# Patient Record
Sex: Male | Born: 1940
Health system: Southern US, Community
[De-identification: ages and names within clinical notes are randomized; demographics above are authoritative.]

## PROBLEM LIST (undated history)

## (undated) DIAGNOSIS — I10 Essential (primary) hypertension: Secondary | ICD-10-CM

## (undated) DIAGNOSIS — H409 Unspecified glaucoma: Secondary | ICD-10-CM

## (undated) DIAGNOSIS — F0280 Dementia in other diseases classified elsewhere without behavioral disturbance: Secondary | ICD-10-CM

## (undated) DIAGNOSIS — G309 Alzheimer's disease, unspecified: Secondary | ICD-10-CM

## (undated) DIAGNOSIS — K219 Gastro-esophageal reflux disease without esophagitis: Secondary | ICD-10-CM

## (undated) HISTORY — DX: Dementia in other diseases classified elsewhere, unspecified severity, without behavioral disturbance, psychotic disturbance, mood disturbance, and anxiety: F02.80

## (undated) HISTORY — DX: Unspecified glaucoma: H40.9

## (undated) HISTORY — DX: Essential (primary) hypertension: I10

## (undated) HISTORY — DX: Alzheimer's disease, unspecified: G30.9

## (undated) HISTORY — DX: Gastro-esophageal reflux disease without esophagitis: K21.9

---

## 2008-08-22 ENCOUNTER — Encounter: Admission: RE | Admit: 2008-08-22 | Discharge: 2008-08-22 | Payer: Self-pay | Admitting: Internal Medicine

## 2009-08-19 ENCOUNTER — Encounter: Admission: RE | Admit: 2009-08-19 | Discharge: 2009-08-19 | Payer: Self-pay | Admitting: Gastroenterology

## 2010-01-26 ENCOUNTER — Encounter: Payer: Self-pay | Admitting: Internal Medicine

## 2011-05-15 IMAGING — CT CT ABDOMEN W/ CM
3 of 8 series · 14 of 36 positions shown, 19 images · IV contrast (READICAT/WATER & [ID] OMNI 300)
Comparison: None

CT CHEST

CLINICAL DATA: 20 pounds weight loss with decreased appetite.  Ex-
smoker.

CT CHEST, ABDOMEN AND PELVIS WITH CONTRAST
TECHNIQUE: Contiguous axial images of the chest abdomen and pelvis
were obtained after IV contrast administration.
Contrast: 100  ml [id number]

[Series 3: chest/abd/pelvis · axial · 0.62mm/px · z∈[-500,-374]mm · 2 of 124 slices shown]
[im 25/124  soft-tissue]
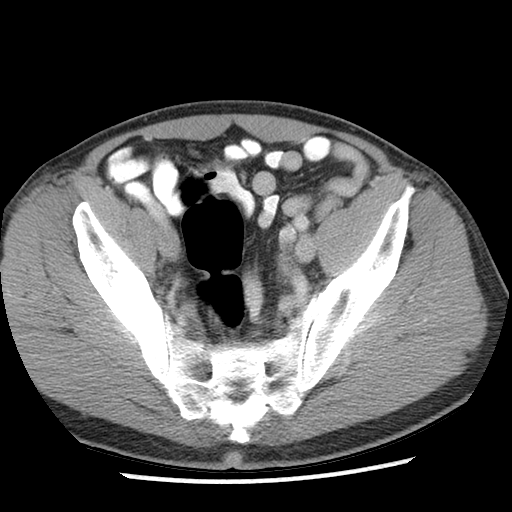
[im 50/124  soft-tissue]
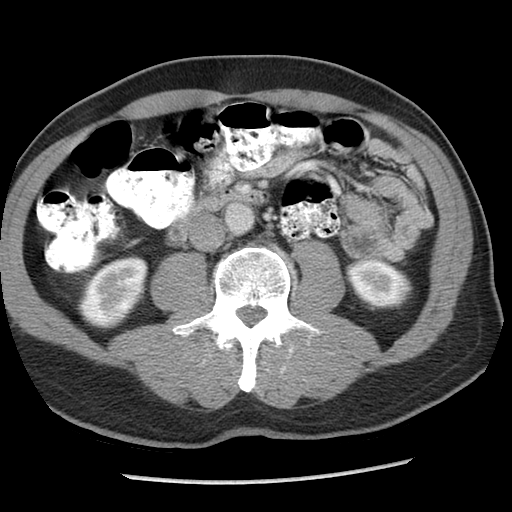

[Series 602: sagittal body · sagittal · 1.24mm/px · 6 of 129 slices shown, 11 images (1 of 2)]
[im 19/129  soft-tissue]
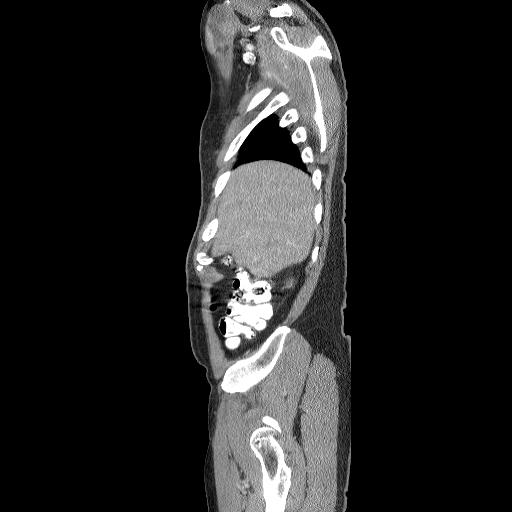
[im 19/129  lung]
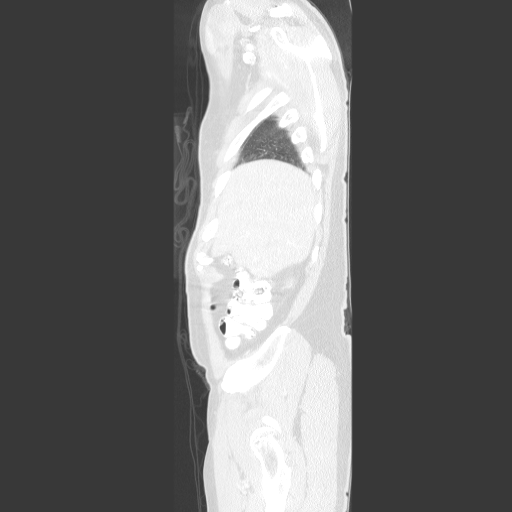
[im 19/129  bone]
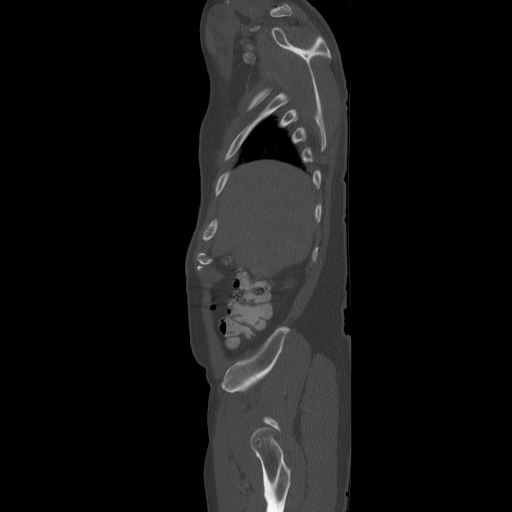
[im 37/129  soft-tissue]
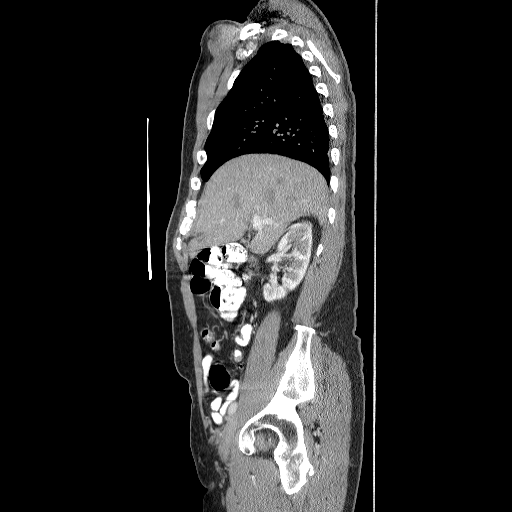
[im 37/129  lung]
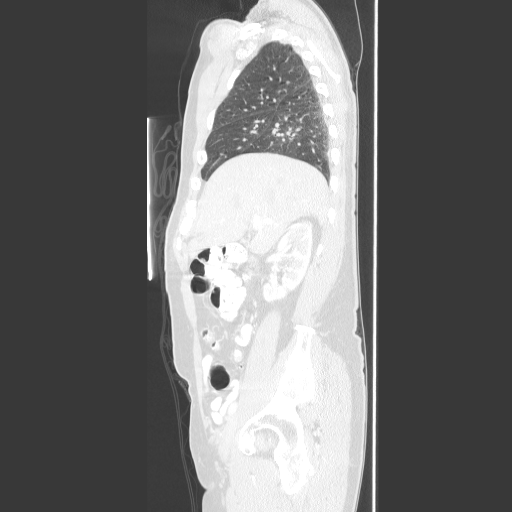
[im 55/129  soft-tissue]
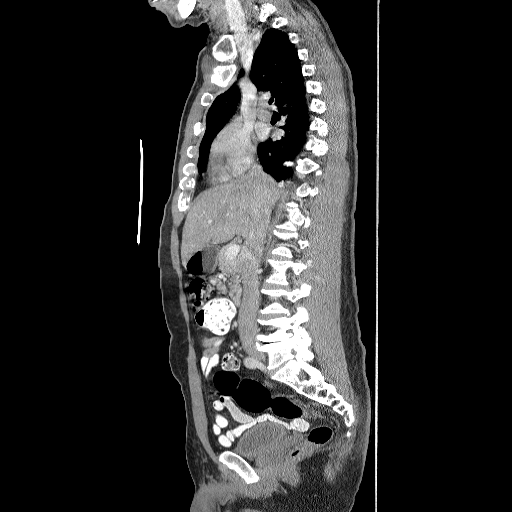
[im 55/129  lung]
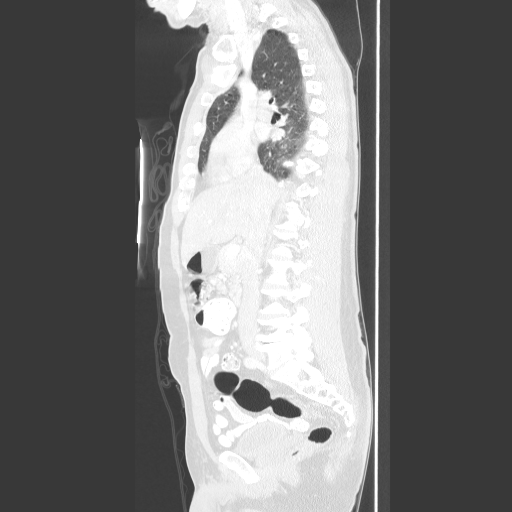
[im 74/129  soft-tissue]
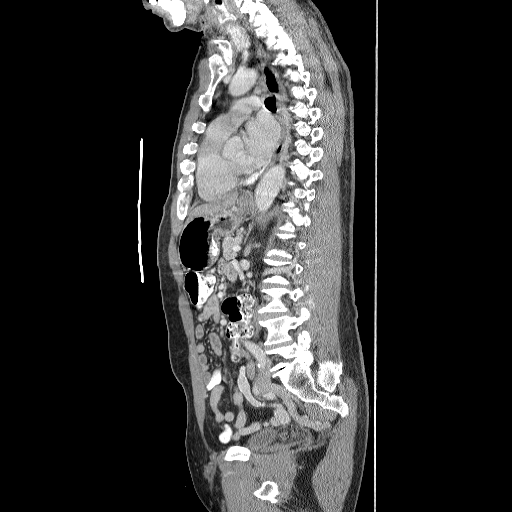
[im 74/129  lung]
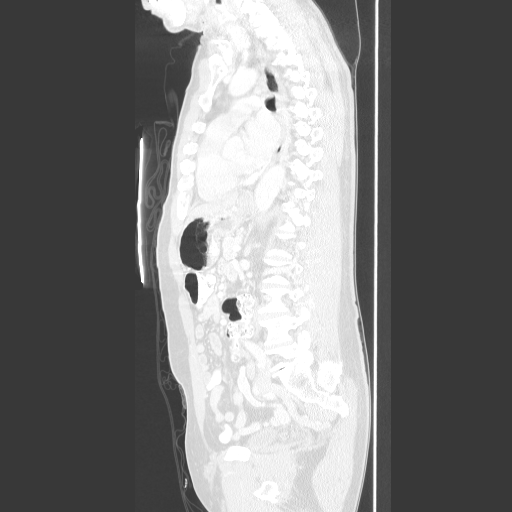
[im 92/129  soft-tissue]
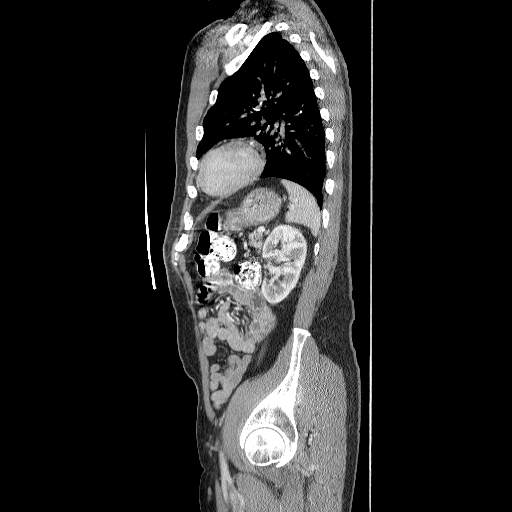
[im 110/129  soft-tissue]
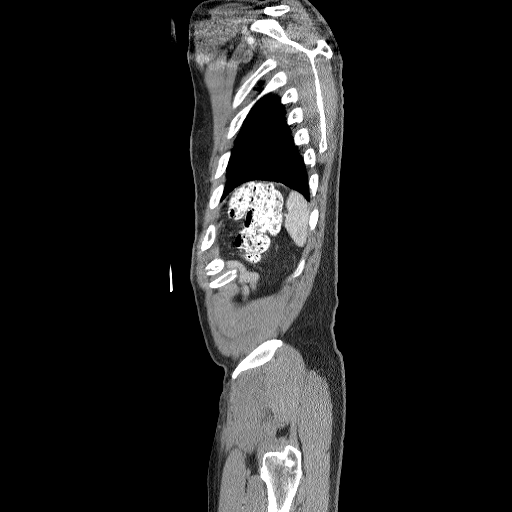

[Series 605: sagittal body · sagittal · 0.62mm/px · 6 of 129 slices shown (2 of 2)]
[im 19/129  soft-tissue]
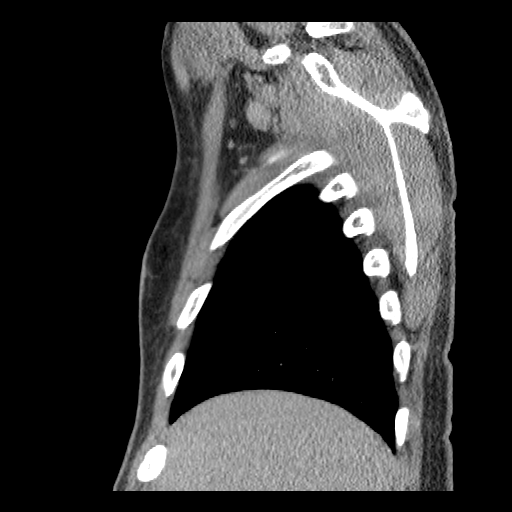
[im 37/129  soft-tissue]
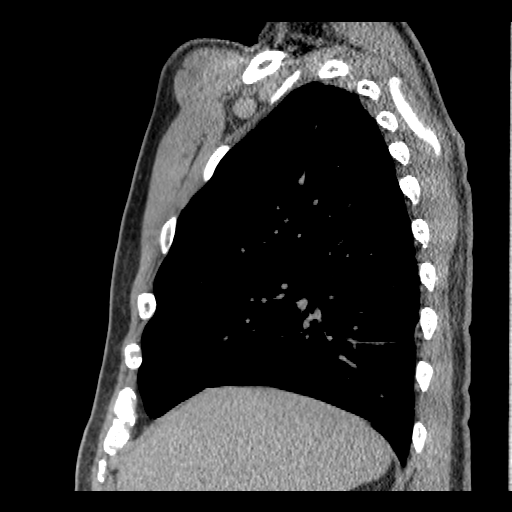
[im 55/129  soft-tissue]
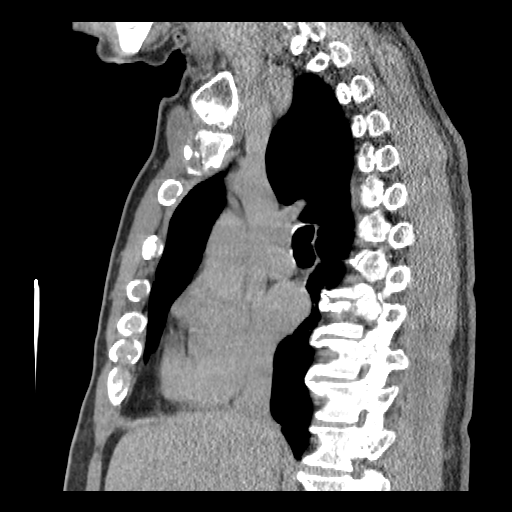
[im 74/129  soft-tissue]
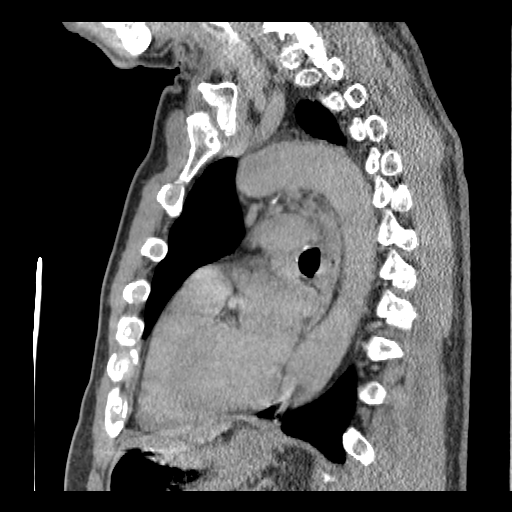
[im 92/129  soft-tissue]
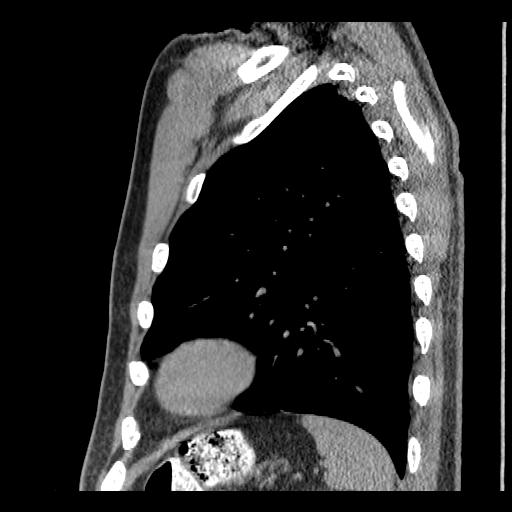
[im 110/129  soft-tissue]
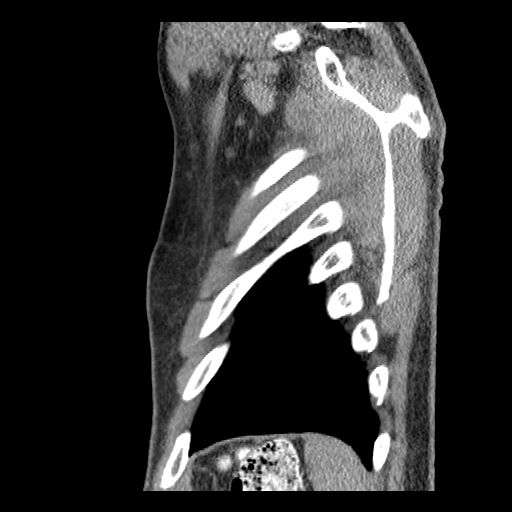

[14 of 36 positions shown; findings below may reference images not displayed]

FINDINGS: Lung windows demonstrate mild paraseptal and
centrilobular emphysema.
No lung nodule or airspace disease.

Soft tissue windows demonstrate normal heart size without
pericardial or pleural effusion.

A low right paratracheal lymph node measures 1.1 cm on image 24 of
series 7.  An AP window lymph node measures 1.2 cm on image 24 of
series 7.  No hilar adenopathy.

Mild esophageal dilatation with an air-fluid level within.  Example
image 23.
IMPRESSION: 1. No acute process in the chest.
2.  Mildly prominent mediastinal lymph nodes, likely reactive.
Consider follow-up at 3 months to confirm stability.
3.  Mild paraseptal and centrilobular emphysema.
4.  Esophageal findings which suggest dysmotility or
gastroesophageal reflux disease.

CT ABDOMEN AND PELVIS
FINDINGS: Normal liver, spleen, stomach, pancreas.  Gallbladder
not visualized, likely surgically absent.  No biliary ductal
dilatation.  Normal adrenal glands.  Too small to characterize
right renal lesions.  Lower pole left renal cyst.

No retroperitoneal or retrocrural adenopathy.

Normal colon, appendix, and terminal ileum.  Normal small bowel
without abdominal ascites.

  No pelvic adenopathy.    Normal urinary bladder and prostate.  No
significant free fluid.  No acute osseous abnormality. Degenerative
disease at L4-S1.
IMPRESSION: No acute process in the abdomen or pelvis.

## 2016-03-27 DIAGNOSIS — E559 Vitamin D deficiency, unspecified: Secondary | ICD-10-CM | POA: Diagnosis not present

## 2016-03-27 DIAGNOSIS — G301 Alzheimer's disease with late onset: Secondary | ICD-10-CM | POA: Diagnosis not present

## 2016-03-27 DIAGNOSIS — R05 Cough: Secondary | ICD-10-CM | POA: Diagnosis not present

## 2016-03-27 DIAGNOSIS — I10 Essential (primary) hypertension: Secondary | ICD-10-CM | POA: Diagnosis not present

## 2016-06-29 ENCOUNTER — Other Ambulatory Visit: Payer: Self-pay | Admitting: Nurse Practitioner

## 2016-06-29 ENCOUNTER — Ambulatory Visit
Admission: RE | Admit: 2016-06-29 | Discharge: 2016-06-29 | Disposition: A | Discharge status: Home / Self Care | Payer: Self-pay | Source: Ambulatory Visit | Attending: Nurse Practitioner | Admitting: Nurse Practitioner

## 2016-06-29 DIAGNOSIS — R05 Cough: Secondary | ICD-10-CM

## 2016-06-29 DIAGNOSIS — R609 Edema, unspecified: Secondary | ICD-10-CM | POA: Diagnosis not present

## 2016-06-29 DIAGNOSIS — R059 Cough, unspecified: Secondary | ICD-10-CM

## 2016-06-29 DIAGNOSIS — R0689 Other abnormalities of breathing: Secondary | ICD-10-CM | POA: Diagnosis not present

## 2017-01-01 DIAGNOSIS — I10 Essential (primary) hypertension: Secondary | ICD-10-CM | POA: Diagnosis not present

## 2017-03-16 DIAGNOSIS — K5730 Diverticulosis of large intestine without perforation or abscess without bleeding: Secondary | ICD-10-CM | POA: Diagnosis not present

## 2017-03-16 DIAGNOSIS — Z86010 Personal history of colonic polyps: Secondary | ICD-10-CM | POA: Diagnosis not present

## 2017-03-16 DIAGNOSIS — R195 Other fecal abnormalities: Secondary | ICD-10-CM | POA: Diagnosis not present

## 2017-03-16 DIAGNOSIS — K921 Melena: Secondary | ICD-10-CM | POA: Diagnosis not present

## 2017-03-17 DIAGNOSIS — K2950 Unspecified chronic gastritis without bleeding: Secondary | ICD-10-CM | POA: Diagnosis not present

## 2017-03-17 DIAGNOSIS — K921 Melena: Secondary | ICD-10-CM | POA: Diagnosis not present

## 2017-03-17 DIAGNOSIS — K219 Gastro-esophageal reflux disease without esophagitis: Secondary | ICD-10-CM | POA: Diagnosis not present

## 2017-03-17 DIAGNOSIS — K3189 Other diseases of stomach and duodenum: Secondary | ICD-10-CM | POA: Diagnosis not present

## 2017-03-17 DIAGNOSIS — K2970 Gastritis, unspecified, without bleeding: Secondary | ICD-10-CM | POA: Diagnosis not present

## 2017-07-05 DIAGNOSIS — K219 Gastro-esophageal reflux disease without esophagitis: Secondary | ICD-10-CM | POA: Diagnosis not present

## 2017-07-05 DIAGNOSIS — E559 Vitamin D deficiency, unspecified: Secondary | ICD-10-CM | POA: Diagnosis not present

## 2017-07-05 DIAGNOSIS — R411 Anterograde amnesia: Secondary | ICD-10-CM | POA: Diagnosis not present

## 2017-07-05 DIAGNOSIS — H28 Cataract in diseases classified elsewhere: Secondary | ICD-10-CM | POA: Diagnosis not present

## 2017-07-05 DIAGNOSIS — H409 Unspecified glaucoma: Secondary | ICD-10-CM | POA: Diagnosis not present

## 2017-07-05 DIAGNOSIS — I10 Essential (primary) hypertension: Secondary | ICD-10-CM | POA: Diagnosis not present

## 2017-07-05 LAB — LIPID PANEL
Cholesterol: 118 (ref 0–200)
HDL: 48 (ref 35–70)
LDL Cholesterol: 61
Triglycerides: 46 (ref 40–160)

## 2017-07-05 LAB — HEPATIC FUNCTION PANEL: Bilirubin, Total: 0.9

## 2017-07-05 LAB — CBC AND DIFFERENTIAL
HCT: 37 — AB (ref 41–53)
Hemoglobin: 11.8 — AB (ref 13.5–17.5)
Platelets: 113 — AB (ref 150–399)
WBC: 4.1

## 2017-07-05 LAB — BASIC METABOLIC PANEL WITH GFR
BUN: 11 (ref 4–21)
Creatinine: 1.1 (ref 0.6–1.3)
Glucose: 94
Potassium: 3.1 — AB (ref 3.4–5.3)
Sodium: 145 (ref 137–147)

## 2017-07-05 LAB — VITAMIN B12: Vitamin B-12: >2000

## 2017-07-05 LAB — HEMOGLOBIN A1C: Hemoglobin A1C: 5.3

## 2017-07-05 LAB — TSH: TSH: 1.13 (ref 0.41–5.90)

## 2017-07-05 LAB — VITAMIN D 25 HYDROXY (VIT D DEFICIENCY, FRACTURES): VIT D 25 HYDROXY: 24.5

## 2017-07-26 DIAGNOSIS — I10 Essential (primary) hypertension: Secondary | ICD-10-CM | POA: Diagnosis not present

## 2017-07-29 DIAGNOSIS — W01198A Fall on same level from slipping, tripping and stumbling with subsequent striking against other object, initial encounter: Secondary | ICD-10-CM | POA: Diagnosis not present

## 2017-07-29 DIAGNOSIS — S0083XA Contusion of other part of head, initial encounter: Secondary | ICD-10-CM | POA: Diagnosis not present

## 2017-07-29 DIAGNOSIS — Z87891 Personal history of nicotine dependence: Secondary | ICD-10-CM | POA: Diagnosis not present

## 2017-07-29 DIAGNOSIS — S62337A Displaced fracture of neck of fifth metacarpal bone, left hand, initial encounter for closed fracture: Secondary | ICD-10-CM | POA: Diagnosis not present

## 2017-07-29 DIAGNOSIS — S62367A Nondisplaced fracture of neck of fifth metacarpal bone, left hand, initial encounter for closed fracture: Secondary | ICD-10-CM | POA: Diagnosis not present

## 2017-07-29 DIAGNOSIS — Z043 Encounter for examination and observation following other accident: Secondary | ICD-10-CM | POA: Diagnosis not present

## 2017-07-29 DIAGNOSIS — I10 Essential (primary) hypertension: Secondary | ICD-10-CM | POA: Diagnosis not present

## 2017-07-29 DIAGNOSIS — Z79899 Other long term (current) drug therapy: Secondary | ICD-10-CM | POA: Diagnosis not present

## 2017-07-29 DIAGNOSIS — G9389 Other specified disorders of brain: Secondary | ICD-10-CM | POA: Diagnosis not present

## 2017-07-29 DIAGNOSIS — S0512XA Contusion of eyeball and orbital tissues, left eye, initial encounter: Secondary | ICD-10-CM | POA: Diagnosis not present

## 2017-07-29 DIAGNOSIS — M7989 Other specified soft tissue disorders: Secondary | ICD-10-CM | POA: Diagnosis not present

## 2017-07-29 DIAGNOSIS — S0990XA Unspecified injury of head, initial encounter: Secondary | ICD-10-CM | POA: Diagnosis not present

## 2017-08-04 DIAGNOSIS — Z87891 Personal history of nicotine dependence: Secondary | ICD-10-CM | POA: Diagnosis not present

## 2017-08-04 DIAGNOSIS — D696 Thrombocytopenia, unspecified: Secondary | ICD-10-CM | POA: Diagnosis not present

## 2017-09-22 ENCOUNTER — Encounter: Payer: Self-pay | Admitting: Nurse Practitioner

## 2017-09-22 DIAGNOSIS — E559 Vitamin D deficiency, unspecified: Secondary | ICD-10-CM

## 2017-09-22 DIAGNOSIS — R609 Edema, unspecified: Secondary | ICD-10-CM

## 2017-09-22 DIAGNOSIS — F0391 Unspecified dementia with behavioral disturbance: Secondary | ICD-10-CM

## 2017-09-22 DIAGNOSIS — F0390 Unspecified dementia without behavioral disturbance: Secondary | ICD-10-CM | POA: Insufficient documentation

## 2017-09-22 DIAGNOSIS — K2100 Gastro-esophageal reflux disease with esophagitis, without bleeding: Secondary | ICD-10-CM

## 2017-09-22 DIAGNOSIS — R159 Full incontinence of feces: Secondary | ICD-10-CM | POA: Insufficient documentation

## 2017-09-22 DIAGNOSIS — K610 Anal abscess: Secondary | ICD-10-CM

## 2017-09-22 DIAGNOSIS — H409 Unspecified glaucoma: Secondary | ICD-10-CM | POA: Insufficient documentation

## 2017-09-22 DIAGNOSIS — K219 Gastro-esophageal reflux disease without esophagitis: Secondary | ICD-10-CM | POA: Insufficient documentation

## 2017-09-22 DIAGNOSIS — D696 Thrombocytopenia, unspecified: Secondary | ICD-10-CM | POA: Diagnosis not present

## 2017-09-22 DIAGNOSIS — R411 Anterograde amnesia: Secondary | ICD-10-CM

## 2017-09-22 DIAGNOSIS — I10 Essential (primary) hypertension: Secondary | ICD-10-CM | POA: Insufficient documentation

## 2017-09-22 DIAGNOSIS — H109 Unspecified conjunctivitis: Secondary | ICD-10-CM | POA: Diagnosis not present

## 2017-09-22 DIAGNOSIS — E138 Other specified diabetes mellitus with unspecified complications: Secondary | ICD-10-CM

## 2017-09-22 DIAGNOSIS — E119 Type 2 diabetes mellitus without complications: Secondary | ICD-10-CM | POA: Insufficient documentation

## 2017-09-22 DIAGNOSIS — G309 Alzheimer's disease, unspecified: Secondary | ICD-10-CM | POA: Diagnosis not present

## 2017-09-22 DIAGNOSIS — H269 Unspecified cataract: Secondary | ICD-10-CM

## 2017-10-06 ENCOUNTER — Ambulatory Visit (INDEPENDENT_AMBULATORY_CARE_PROVIDER_SITE_OTHER): Payer: Medicare Other | Admitting: Nurse Practitioner

## 2017-10-06 ENCOUNTER — Encounter: Payer: Self-pay | Admitting: Nurse Practitioner

## 2017-10-06 VITALS — BP 142/86 | HR 61 | Wt 143.8 lb

## 2017-10-06 DIAGNOSIS — E876 Hypokalemia: Secondary | ICD-10-CM

## 2017-10-06 DIAGNOSIS — R197 Diarrhea, unspecified: Secondary | ICD-10-CM

## 2017-10-06 DIAGNOSIS — I10 Essential (primary) hypertension: Secondary | ICD-10-CM | POA: Diagnosis not present

## 2017-10-06 DIAGNOSIS — R159 Full incontinence of feces: Secondary | ICD-10-CM

## 2017-10-06 DIAGNOSIS — R451 Restlessness and agitation: Secondary | ICD-10-CM

## 2017-10-06 MED ORDER — HYDROCHLOROTHIAZIDE 12.5 MG PO TABS: 12.5000 mg | ORAL_TABLET | Freq: Every day | ORAL | 1 refills | Status: DC

## 2017-10-06 NOTE — Progress Notes (Signed)
Subjective:    Patient ID: Donald Garrett, male    DOB: [birth year], 77 y.o.   MRN: [id number]  Dementia - During his visit with the hematologist he was extremely agitated.  His wife reports he paces more, laughs out. He is now incontinent of his bowels.  She feels he understands what is said to him.  The Hematologist has recommended a Neurologist consult she has not gone due to wanting to speak with me first.    Hypertension  This is a chronic problem. The current episode started more than 1 year ago. The problem is unchanged. The problem is controlled. Pertinent negatives include no anxiety, chest pain, malaise/fatigue or palpitations. There are no associated agents to hypertension. Risk factors for coronary artery disease include male gender and sedentary lifestyle. Past treatments include diuretics. The current treatment provides significant improvement. There are no compliance problems.   Diarrhea   This is a chronic problem. The current episode started more than 1 month ago. The problem occurs less than 2 times per day. The problem has been unchanged. Pertinent negatives include no abdominal pain, chills, fever or vomiting. Nothing aggravates the symptoms. There are no known risk factors.   Past Medical History:  Diagnosis Date  . Alzheimer's disease (HCC)   . GERD (gastroesophageal reflux disease)   . Glaucoma   . Hypertension      Current Outpatient Medications:  .  ascorbic acid (VITAMIN C) 500 MG/5ML syrup, Take 1,000 mg by mouth daily., Disp: , Rfl:  .  b complex-vitamin c-folic acid (NEPHRO-VITE) 0.8 MG TABS tablet, Take 1 tablet by mouth at bedtime., Disp: , Rfl:  .  ciprofloxacin (CILOXAN) 0.3 % ophthalmic solution, Place into the right eye 3 (three) times daily. Administer 1 drop, every 2 hours, while awake, for 2 days. Then 1 drop, every 4 hours, while awake, for the next 5 days., Disp: , Rfl:  .  Probiotic Product (PROBIOTIC-10 PO), Take 1 tablet by mouth daily., Disp: ,  Rfl:  .  hydrochlorothiazide (HYDRODIURIL) 12.5 MG tablet, Take 1 tablet (12.5 mg total) by mouth daily., Disp: 90 tablet, Rfl: 1   Review of Systems  Constitutional: Negative for appetite change, chills, fatigue, fever and malaise/fatigue.  Cardiovascular: Negative for chest pain and palpitations.  Gastrointestinal: Positive for diarrhea. Negative for abdominal pain and vomiting.  Skin: Negative.   Neurological: Negative for dizziness.       Nonverbal.   Psychiatric/Behavioral: Positive for agitation and confusion.       Vitals:   10/06/17 1127  BP: (!) 142/86  Pulse: 61  SpO2: 90%   Vitals:   10/06/17 1127  Weight: 143 lb 12.8 oz (65.2 kg)    Objective:   Physical Exam  Constitutional: He appears well-developed and well-nourished. He is cooperative.  Non-toxic appearance. He appears distressed.  Cardiovascular: Normal rate, regular rhythm and normal pulses.  Pulmonary/Chest: Effort normal and breath sounds normal.  Skin: Skin is warm, dry and intact.  Psychiatric: He expresses inappropriate judgment. He exhibits abnormal recent memory and abnormal remote memory.  Occasional outburst of laughter, yet cooperative.         Assessment & Plan:  1. Essential hypertension  - Chronic, slightly elevated however will not make any changes today. Continue with current medications.  Kidney functions were normal with the Hematologist 09/22/17.    - hydrochlorothiazide (HYDRODIURIL) 12.5 MG tablet; Take 1 tablet (12.5 mg total)     by mouth daily.  Dispense: 90 tablet; Refill: 1  2. Agitation  - Increasing agitation, I will not start a medication as she has a referral to neurology     by the Hematologist.  I have given her the phone number to call and schedule an appts.   3. Diarrhea, unspecified type -  Described more as loose stools, I have advised her to cut back on his fiber intake,    his physical exam is normal including soft abdomen with normoactive bowel sounds.  If persists  his wife is to return call to office.    4. Bowel incontinence - wife reports has been occurring for several months, wearing depends.  Likely related to disease progression of dementia  5. Hypokalemia - Potassium was 3.0 on 09/22/17 - advised to increase his intake of potassium rich foods like Bananas.

## 2017-10-06 NOTE — Patient Instructions (Signed)
-   Cut back on fiber to see if effective to help loose stools - Call Neurologist Dr. Tonie Griffith referred you to.   - Return to office if symptoms worsen.

## 2017-10-13 ENCOUNTER — Telehealth: Payer: Self-pay

## 2017-10-13 DIAGNOSIS — G309 Alzheimer's disease, unspecified: Secondary | ICD-10-CM | POA: Diagnosis not present

## 2017-10-13 DIAGNOSIS — R451 Restlessness and agitation: Secondary | ICD-10-CM | POA: Diagnosis not present

## 2017-10-13 DIAGNOSIS — Z9183 Wandering in diseases classified elsewhere: Secondary | ICD-10-CM | POA: Diagnosis not present

## 2017-10-13 NOTE — Telephone Encounter (Signed)
Patient's wife called stating she got a sooner appointment for him at the neurologist the appointment is tomorrow. She stated we no longer have to order medicine for him she will let the neurologist handle it. YRL,RMA

## 2017-10-28 ENCOUNTER — Encounter: Payer: Self-pay | Admitting: Nurse Practitioner

## 2017-10-28 ENCOUNTER — Ambulatory Visit (INDEPENDENT_AMBULATORY_CARE_PROVIDER_SITE_OTHER): Payer: Medicare Other | Admitting: Nurse Practitioner

## 2017-10-28 VITALS — BP 160/90 | HR 85 | Ht 67.0 in | Wt 145.0 lb

## 2017-10-28 DIAGNOSIS — G301 Alzheimer's disease with late onset: Secondary | ICD-10-CM | POA: Diagnosis not present

## 2017-10-28 DIAGNOSIS — E559 Vitamin D deficiency, unspecified: Secondary | ICD-10-CM | POA: Diagnosis not present

## 2017-10-28 DIAGNOSIS — H269 Unspecified cataract: Secondary | ICD-10-CM

## 2017-10-28 DIAGNOSIS — I10 Essential (primary) hypertension: Secondary | ICD-10-CM

## 2017-10-28 DIAGNOSIS — H409 Unspecified glaucoma: Secondary | ICD-10-CM

## 2017-10-28 DIAGNOSIS — F0281 Dementia in other diseases classified elsewhere with behavioral disturbance: Secondary | ICD-10-CM

## 2017-10-28 NOTE — Progress Notes (Signed)
  Subjective:     Patient ID: Donald Garrett , male    DOB: 1940/12/20 , 77 y.o.   MRN: [id number]   Here today for evaluation to complete FL2 form to be [facility]) locked unit due to his worsening dementia/alzheimer's.  He is beginning to wander out of the house.  His wife reports he paces the floor at night and will often find him voiding in the corner. He is now incontinent of bowel and bladder.  She is having a challenging time caring for him in fear he will leave the house and get lost or hurt.      Past Medical History:  Diagnosis Date  . Alzheimer's disease (HCC)   . GERD (gastroesophageal reflux disease)   . Glaucoma   . Hypertension       Current Outpatient Medications:  .  ascorbic acid (VITAMIN C) 500 MG/5ML syrup, Take 1,000 mg by mouth daily., Disp: , Rfl:  .  b complex-vitamin c-folic acid (NEPHRO-VITE) 0.8 MG TABS tablet, Take 1 tablet by mouth at bedtime., Disp: , Rfl:  .  ciprofloxacin (CILOXAN) 0.3 % ophthalmic solution, Place into the right eye 3 (three) times daily. Administer 1 drop, every 2 hours, while awake, for 2 days. Then 1 drop, every 4 hours, while awake, for the next 5 days., Disp: , Rfl:  .  hydrochlorothiazide (HYDRODIURIL) 12.5 MG tablet, Take 1 tablet (12.5 mg total) by mouth daily., Disp: 90 tablet, Rfl: 1 .  Probiotic Product (PROBIOTIC-10 PO), Take 1 tablet by mouth daily., Disp: , Rfl:    No Known Allergies   Review of Systems  Constitutional: Negative.   HENT: Negative.   Eyes: Negative.   Respiratory: Negative.   Cardiovascular: Positive for leg swelling.  Musculoskeletal: Negative.   Skin: Negative.   Neurological: Negative.   Psychiatric/Behavioral: Positive for agitation, behavioral problems, confusion and sleep disturbance.     Today's Vitals   10/28/17 0853  BP: (!) 160/90  Pulse: 85  SpO2: 90%  Weight: 145 lb (65.8 kg)  Height: 5\' 7"  (1.702 m)  PainSc: 0-No pain    Body mass index is 22.71 kg/m.   Objective:  Physical Exam  Constitutional: He appears well-developed and well-nourished.  HENT:  Head: Normocephalic.  Neck: Normal range of motion.  Cardiovascular: Normal rate, regular rhythm and normal heart sounds.  Pulmonary/Chest: Effort normal and breath sounds normal.  Musculoskeletal: Normal range of motion.  Neurological: He is alert. He is disoriented.  Does not follow simple commands, has to have frequent redirection and prompting.        Assessment And Plan:     1. Essential hypertension  Chronic, slightly elevated today has not taken his medication this am  Continue with current medications   2. Late onset Alzheimer's disease with behavioral disturbance (HCC)  Worsening, now wandering becoming more agitated and at times aggressive.   FL 2 form completed and given to the wife.  [phone] (P), [phone] (F)   3. Glaucoma, unspecified glaucoma type, unspecified laterality  Wife reports he is to use eye drops but she has not been using with him.  He does tend to reach for things as though he is having difficulty with vision  4. Cataract, unspecified cataract type, unspecified laterality  Being followed by opthamology  5. Vitamin D deficiency Chronic, slightly low. No current medications at this time       Arnette Felts, FNP

## 2017-11-01 NOTE — Addendum Note (Signed)
Addended by: Arnette Felts F on: 11/01/2017 05:30 PM   Modules accepted: Level of Service

## 2017-11-10 ENCOUNTER — Telehealth: Payer: Self-pay | Admitting: Nurse Practitioner

## 2017-11-10 NOTE — Telephone Encounter (Signed)
Inquiring about form she left for [facility]

## 2017-11-11 ENCOUNTER — Other Ambulatory Visit: Payer: Self-pay | Admitting: Nurse Practitioner

## 2017-11-11 DIAGNOSIS — R197 Diarrhea, unspecified: Secondary | ICD-10-CM

## 2017-11-11 NOTE — Progress Notes (Signed)
j

## 2018-01-12 ENCOUNTER — Ambulatory Visit (INDEPENDENT_AMBULATORY_CARE_PROVIDER_SITE_OTHER): Payer: Medicare Other

## 2018-01-12 VITALS — BP 160/90 | HR 69 | Ht 67.0 in | Wt 158.0 lb

## 2018-01-12 DIAGNOSIS — Z0000 Encounter for general adult medical examination without abnormal findings: Secondary | ICD-10-CM

## 2018-01-12 NOTE — Progress Notes (Signed)
Subjective:   Donald Garrett is a 78 y.o. male who presents for Medicare Annual/Subsequent preventive examination.  Review of Systems:  n/a Cardiac Risk Factors include: advanced age (>32men, >31 women);male gender;sedentary lifestyle;hypertension     Objective:    Vitals: BP (!) 160/90 (BP Location: Left Arm, Patient Position: Sitting)   Pulse 69   Ht 5\' 7"  (1.702 m)   Wt 158 lb (71.7 kg)   SpO2 97%   BMI 24.75 kg/m   Body mass index is 24.75 kg/m.  Advanced Directives 01/12/2018  Does Patient Have a Medical Advance Directive? Yes  Type of Advance Directive Healthcare Power of Attorney    Tobacco Social History   Tobacco Use  Smoking Status Former Smoker  Smokeless Tobacco Former Social worker given: Not Answered   Clinical Intake:  Pre-visit preparation completed: Yes  Pain : No/denies pain Pain Score: 0-No pain     Nutritional Status: BMI of 19-24  Normal Nutritional Risks: None Diabetes: No  How often do you need to have someone help you when you read instructions, pamphlets, or other written materials from your doctor or pharmacy?: 5 - Always What is the last grade level you completed in school?: High school  Interpreter Needed?: No(dementia, spouse answering)  Comments: Spouse answering due to dementia Information entered by :: NAllen  Past Medical History:  Diagnosis Date  . Alzheimer's disease (HCC)   . GERD (gastroesophageal reflux disease)   . Glaucoma   . Hypertension    History reviewed. No pertinent surgical history. History reviewed. No pertinent family history. Social History   Socioeconomic History  . Marital status: Married    Spouse name: Not on file  . Number of children: Not on file  . Years of education: Not on file  . Highest education level: Not on file  Occupational History  . Occupation: retired  Engineer, production  . Financial resource strain: Not hard at all  . Food insecurity:    Worry: Never true   Inability: Never true  . Transportation needs:    Medical: No    Non-medical: No  Tobacco Use  . Smoking status: Former Games developer  . Smokeless tobacco: Former Engineer, water and Sexual Activity  . Alcohol use: Never    Frequency: Never  . Drug use: Never  . Sexual activity: Not Currently  Lifestyle  . Physical activity:    Days per week: 0 days    Minutes per session: 0 min  . Stress: Not on file  Relationships  . Social connections:    Talks on phone: Not on file    Gets together: Not on file    Attends religious service: Not on file    Active member of club or organization: Not on file    Attends meetings of clubs or organizations: Not on file    Relationship status: Not on file  Other Topics Concern  . Not on file  Social History Narrative   Patient has dementia. Spouse tends to his needs as well as daughter.    Outpatient Encounter Medications as of 01/12/2018  Medication Sig  . ascorbic acid (VITAMIN C) 500 MG/5ML syrup Take 1,000 mg by mouth daily.  Marland Kitchen b complex-vitamin c-folic acid (NEPHRO-VITE) 0.8 MG TABS tablet Take 1 tablet by mouth at bedtime.  . hydrochlorothiazide (HYDRODIURIL) 12.5 MG tablet Take 1 tablet (12.5 mg total) by mouth daily.  . Melatonin 3 MG TABS Take by mouth.  . Probiotic Product (PROBIOTIC-10  PO) Take 1 tablet by mouth daily.  . sertraline (ZOLOFT) 50 MG tablet Take by mouth.   No facility-administered encounter medications on file as of 01/12/2018.     Activities of Daily Living In your present state of health, do you have any difficulty performing the following activities: 01/12/2018  Hearing? Y  Comment right ear hearing loss  Vision? Y  Comment glaucoma and cataracts  Difficulty concentrating or making decisions? Y  Comment dementia  Walking or climbing stairs? Y  Dressing or bathing? Y  Comment supervision with bathing and dressing  Doing errands, shopping? Y  Comment dementia  Preparing Food and eating ? Y  Comment dementia  Using  the Toilet? Y  Comment incontinent   In the past six months, have you accidently leaked urine? Y  Comment incontinent  Do you have problems with loss of bowel control? Y  Comment incontinent  Managing your Medications? Y  Comment spouse manages  Managing your Finances? Y  Comment spouse Chief Executive Officermanages  Housekeeping or managing your Housekeeping? Y  Comment spouse manages  Some recent data might be hidden    Patient Care Team: Arnette FeltsMoore, Janece, FNP as PCP - General (General Practice)   Assessment:   This is a routine wellness examination for [name].  Exercise Activities and Dietary recommendations Current Exercise Habits: The patient does not participate in regular exercise at present, Exercise limited by: neurologic condition(s)(dementia)  Goals   None     Fall Risk Fall Risk  01/12/2018 10/06/2017  Falls in the past year? 1 Yes  Number falls in past yr: 0 1  Injury with Fall? 1 Yes  Comment fractured pinky -  Risk for fall due to : History of fall(s) -   Is the patient's home free of loose throw rugs in walkways, pet beds, electrical cords, etc?   yes      Grab bars in the bathroom? no      Handrails on the stairs?  n/a      Adequate lighting?   yes  Timed Get Up and Go Performed: n/a  Depression Screen PHQ 2/9 Scores 01/12/2018 10/06/2017  PHQ - 2 Score - 0  Exception Documentation Medical reason -    Cognitive Function     6CIT Screen 01/12/2018  What Year? 0 points     There is no immunization history on file for this patient.  Qualifies for Shingles Vaccine? yes  Screening Tests Health Maintenance  Topic Date Due  . FOOT EXAM  [birth year]/21/1950  . OPHTHALMOLOGY EXAM  08/21/1948  . URINE MICROALBUMIN  08/21/1948  . HEMOGLOBIN A1C  01/05/2018  . INFLUENZA VACCINE  10/07/2018 (Originally 08/05/2017)  . PNA vac Low Risk Adult (1 of 2 - PCV13) 01/13/2019 (Originally 08/22/2003)  . TETANUS/TDAP  09/03/2022   Cancer Screenings: Lung: Low Dose CT Chest recommended if Age  77-80 years, 30 pack-year currently smoking OR have quit w/in 15years. Patient does not qualify. Colorectal: not required  Additional Screenings:  Hepatitis C Screening:n/a      Plan:    Patient has dementia and was accompanied by spouse. He was unable to do cognitive testing. Spouse states they do not get vaccinations.  I have personally reviewed and noted the following in the patient's chart:   . Medical and social history . Use of alcohol, tobacco or illicit drugs  . Current medications and supplements . Functional ability and status . Nutritional status . Physical activity . Advanced directives . List of other physicians . Hospitalizations,  surgeries, and ER visits in previous 12 months . Vitals . Screenings to include cognitive, depression, and falls . Referrals and appointments  In addition, I have reviewed and discussed with patient certain preventive protocols, quality metrics, and best practice recommendations. A written personalized care plan for preventive services as well as general preventive health recommendations were provided to patient.     Barb Merinoickeah E Leor Whyte, LPN  1/6/10961/08/2018

## 2018-01-12 NOTE — Patient Instructions (Signed)
Donald Garrett , Thank you for taking time to come for your Medicare Wellness Visit. I appreciate your ongoing commitment to your health goals. Please review the following plan we discussed and let me know if I can assist you in the future.   Screening recommendations/referrals: Colonoscopy: not required Recommended yearly ophthalmology/optometry visit for glaucoma screening and checkup Recommended yearly dental visit for hygiene and checkup  Vaccinations: Influenza vaccine: decline Pneumococcal vaccine: decline Tdap vaccine: 08/2012 Shingles vaccine: decline    Advanced directives: Advance directive discussed with you today. Even though you declined this today please call our office should you change your mind and we can give you the proper paperwork for you to fill out.   Conditions/risks identified: Dementia  Next appointment: 04/07/2018 at 10:30a  Preventive Care 65 Years and Older, Male Preventive care refers to lifestyle choices and visits with your health care provider that can promote health and wellness. What does preventive care include?  A yearly physical exam. This is also called an annual well check.  Dental exams once or twice a year.  Routine eye exams. Ask your health care provider how often you should have your eyes checked.  Personal lifestyle choices, including:  Daily care of your teeth and gums.  Regular physical activity.  Eating a healthy diet.  Avoiding tobacco and drug use.  Limiting alcohol use.  Practicing safe sex.  Taking low doses of aspirin every day.  Taking vitamin and mineral supplements as recommended by your health care provider. What happens during an annual well check? The services and screenings done by your health care provider during your annual well check will depend on your age, overall health, lifestyle risk factors, and family history of disease. Counseling  Your health care provider may ask you questions about your:  Alcohol  use.  Tobacco use.  Drug use.  Emotional well-being.  Home and relationship well-being.  Sexual activity.  Eating habits.  History of falls.  Memory and ability to understand (cognition).  Work and work Astronomer. Screening  You may have the following tests or measurements:  Height, weight, and BMI.  Blood pressure.  Lipid and cholesterol levels. These may be checked every 5 years, or more frequently if you are over 56 years old.  Skin check.  Lung cancer screening. You may have this screening every year starting at age 72 if you have a 30-pack-year history of smoking and currently smoke or have quit within the past 15 years.  Fecal occult blood test (FOBT) of the stool. You may have this test every year starting at age 77.  Flexible sigmoidoscopy or colonoscopy. You may have a sigmoidoscopy every 5 years or a colonoscopy every 10 years starting at age 80.  Prostate cancer screening. Recommendations will vary depending on your family history and other risks.  Hepatitis C blood test.  Hepatitis B blood test.  Sexually transmitted disease (STD) testing.  Diabetes screening. This is done by checking your blood sugar (glucose) after you have not eaten for a while (fasting). You may have this done every 1-3 years.  Abdominal aortic aneurysm (AAA) screening. You may need this if you are a current or former smoker.  Osteoporosis. You may be screened starting at age 36 if you are at high risk. Talk with your health care provider about your test results, treatment options, and if necessary, the need for more tests. Vaccines  Your health care provider may recommend certain vaccines, such as:  Influenza vaccine. This is recommended every  year.  Tetanus, diphtheria, and acellular pertussis (Tdap, Td) vaccine. You may need a Td booster every 10 years.  Zoster vaccine. You may need this after age 42.  Pneumococcal 13-valent conjugate (PCV13) vaccine. One dose is  recommended after age 19.  Pneumococcal polysaccharide (PPSV23) vaccine. One dose is recommended after age 72. Talk to your health care provider about which screenings and vaccines you need and how often you need them. This information is not intended to replace advice given to you by your health care provider. Make sure you discuss any questions you have with your health care provider. Document Released: 01/18/2015 Document Revised: 09/11/2015 Document Reviewed: 10/23/2014 Elsevier Interactive Patient Education  2017 [place] Prevention in the Home Falls can cause injuries. They can happen to people of all ages. There are many things you can do to make your home safe and to help prevent falls. What can I do on the outside of my home?  Regularly fix the edges of walkways and driveways and fix any cracks.  Remove anything that might make you trip as you walk through a door, such as a raised step or threshold.  Trim any bushes or trees on the path to your home.  Use bright outdoor lighting.  Clear any walking paths of anything that might make someone trip, such as rocks or tools.  Regularly check to see if handrails are loose or broken. Make sure that both sides of any steps have handrails.  Any raised decks and porches should have guardrails on the edges.  Have any leaves, snow, or ice cleared regularly.  Use sand or salt on walking paths during winter.  Clean up any spills in your garage right away. This includes oil or grease spills. What can I do in the bathroom?  Use night lights.  Install grab bars by the toilet and in the tub and shower. Do not use towel bars as grab bars.  Use non-skid mats or decals in the tub or shower.  If you need to sit down in the shower, use a plastic, non-slip stool.  Keep the floor dry. Clean up any water that spills on the floor as soon as it happens.  Remove soap buildup in the tub or shower regularly.  Attach bath mats  securely with double-sided non-slip rug tape.  Do not have throw rugs and other things on the floor that can make you trip. What can I do in the bedroom?  Use night lights.  Make sure that you have a light by your bed that is easy to reach.  Do not use any sheets or blankets that are too big for your bed. They should not hang down onto the floor.  Have a firm chair that has side arms. You can use this for support while you get dressed.  Do not have throw rugs and other things on the floor that can make you trip. What can I do in the kitchen?  Clean up any spills right away.  Avoid walking on wet floors.  Keep items that you use a lot in easy-to-reach places.  If you need to reach something above you, use a strong step stool that has a grab bar.  Keep electrical cords out of the way.  Do not use floor polish or wax that makes floors slippery. If you must use wax, use non-skid floor wax.  Do not have throw rugs and other things on the floor that can make you trip. What can  I do with my stairs?  Do not leave any items on the stairs.  Make sure that there are handrails on both sides of the stairs and use them. Fix handrails that are broken or loose. Make sure that handrails are as long as the stairways.  Check any carpeting to make sure that it is firmly attached to the stairs. Fix any carpet that is loose or worn.  Avoid having throw rugs at the top or bottom of the stairs. If you do have throw rugs, attach them to the floor with carpet tape.  Make sure that you have a light switch at the top of the stairs and the bottom of the stairs. If you do not have them, ask someone to add them for you. What else can I do to help prevent falls?  Wear shoes that:  Do not have high heels.  Have rubber bottoms.  Are comfortable and fit you well.  Are closed at the toe. Do not wear sandals.  If you use a stepladder:  Make sure that it is fully opened. Do not climb a closed  stepladder.  Make sure that both sides of the stepladder are locked into place.  Ask someone to hold it for you, if possible.  Clearly mark and make sure that you can see:  Any grab bars or handrails.  First and last steps.  Where the edge of each step is.  Use tools that help you move around (mobility aids) if they are needed. These include:  Canes.  Walkers.  Scooters.  Crutches.  Turn on the lights when you go into a dark area. Replace any light bulbs as soon as they burn out.  Set up your furniture so you have a clear path. Avoid moving your furniture around.  If any of your floors are uneven, fix them.  If there are any pets around you, be aware of where they are.  Review your medicines with your doctor. Some medicines can make you feel dizzy. This can increase your chance of falling. Ask your doctor what other things that you can do to help prevent falls. This information is not intended to replace advice given to you by your health care provider. Make sure you discuss any questions you have with your health care provider. Document Released: 10/18/2008 Document Revised: 05/30/2015 Document Reviewed: 01/26/2014 Elsevier Interactive Patient Education  2017 [employer].

## 2018-03-25 IMAGING — DX DG CHEST 2V
2 series · 2 of 2 positions shown · non-contrast
Comparison: 08/19/2009 chest CT

CLINICAL DATA: Cough and chest congestion.

EXAM:
CHEST  2 VIEW

[dg chest 2 view (1 of 2)]
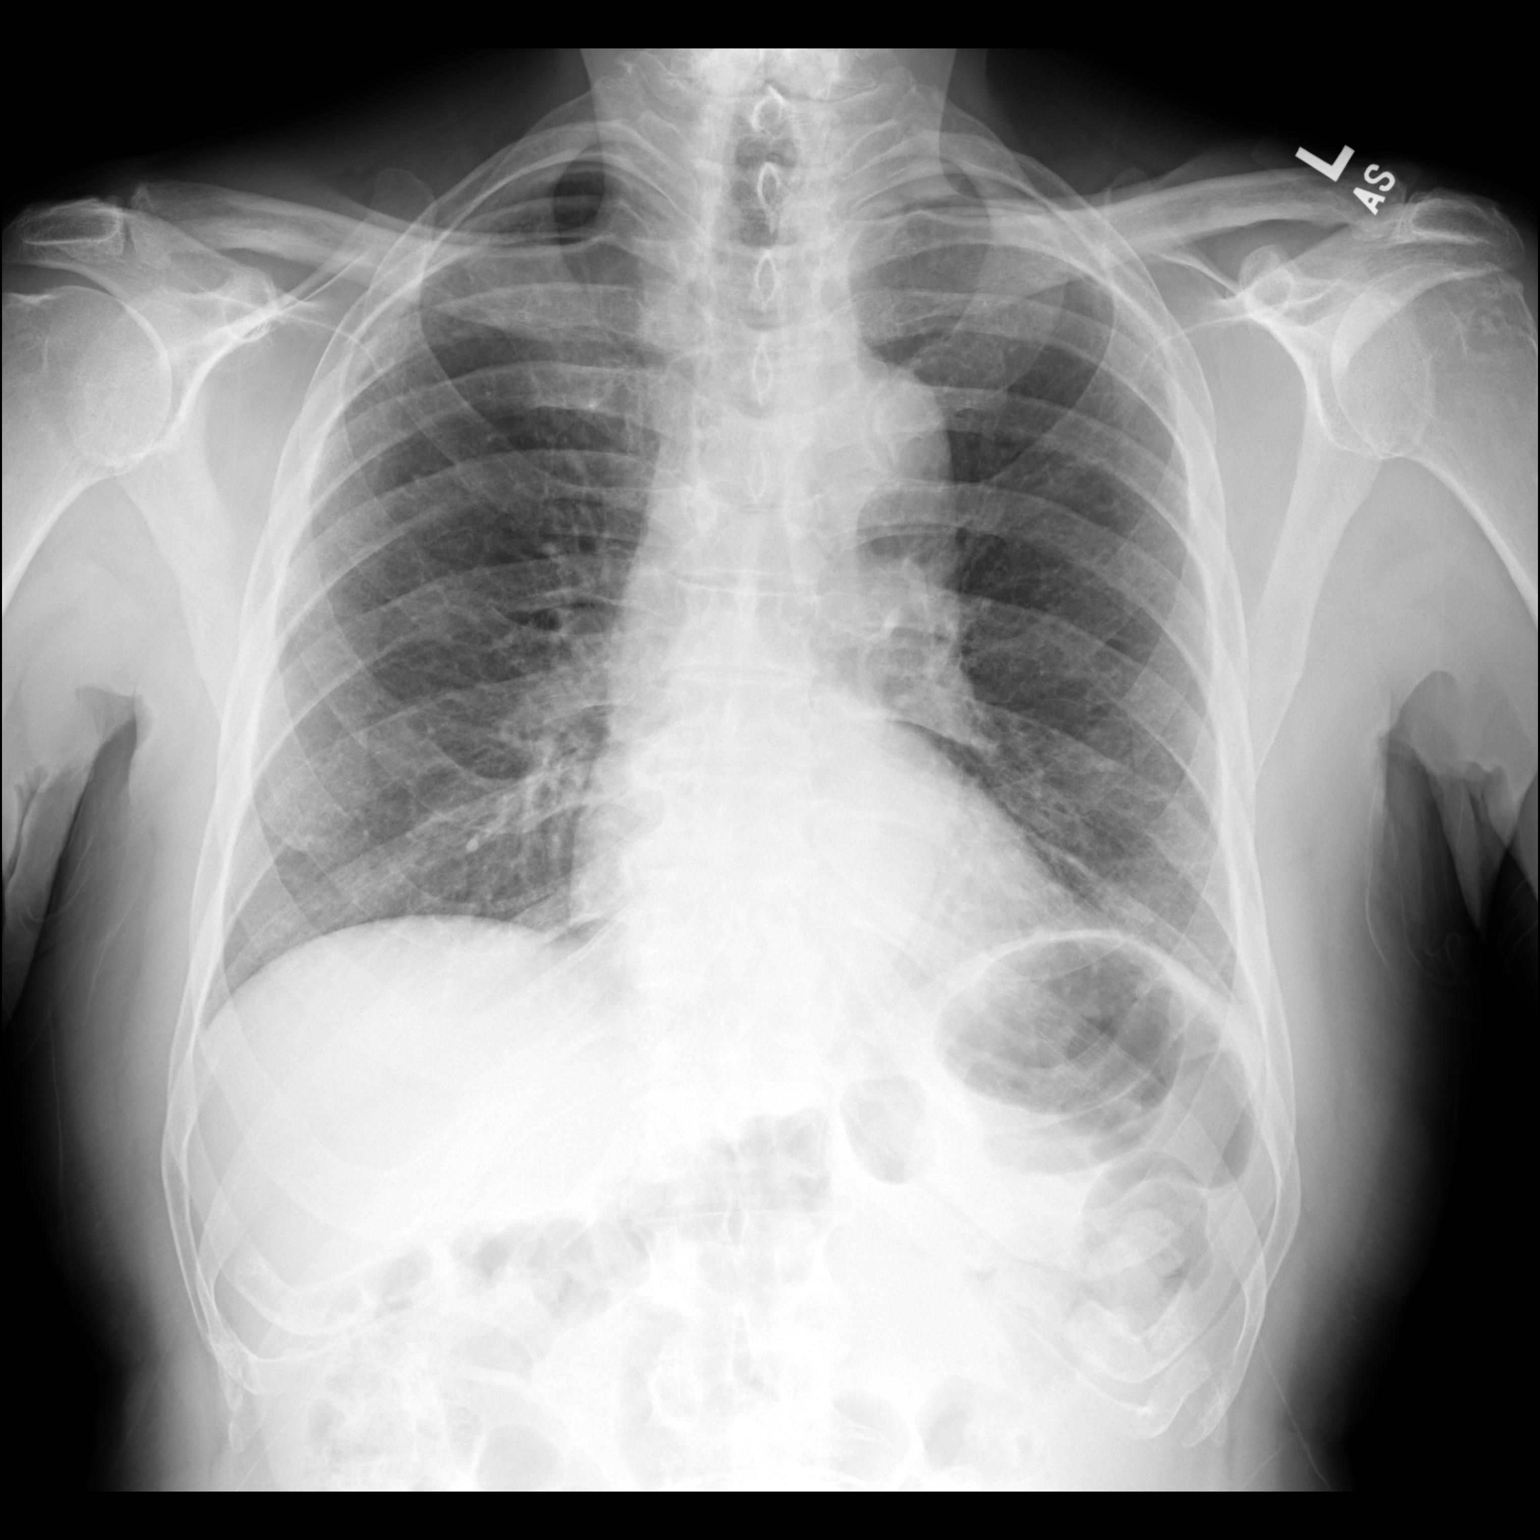

[dg chest 2 view (2 of 2)]
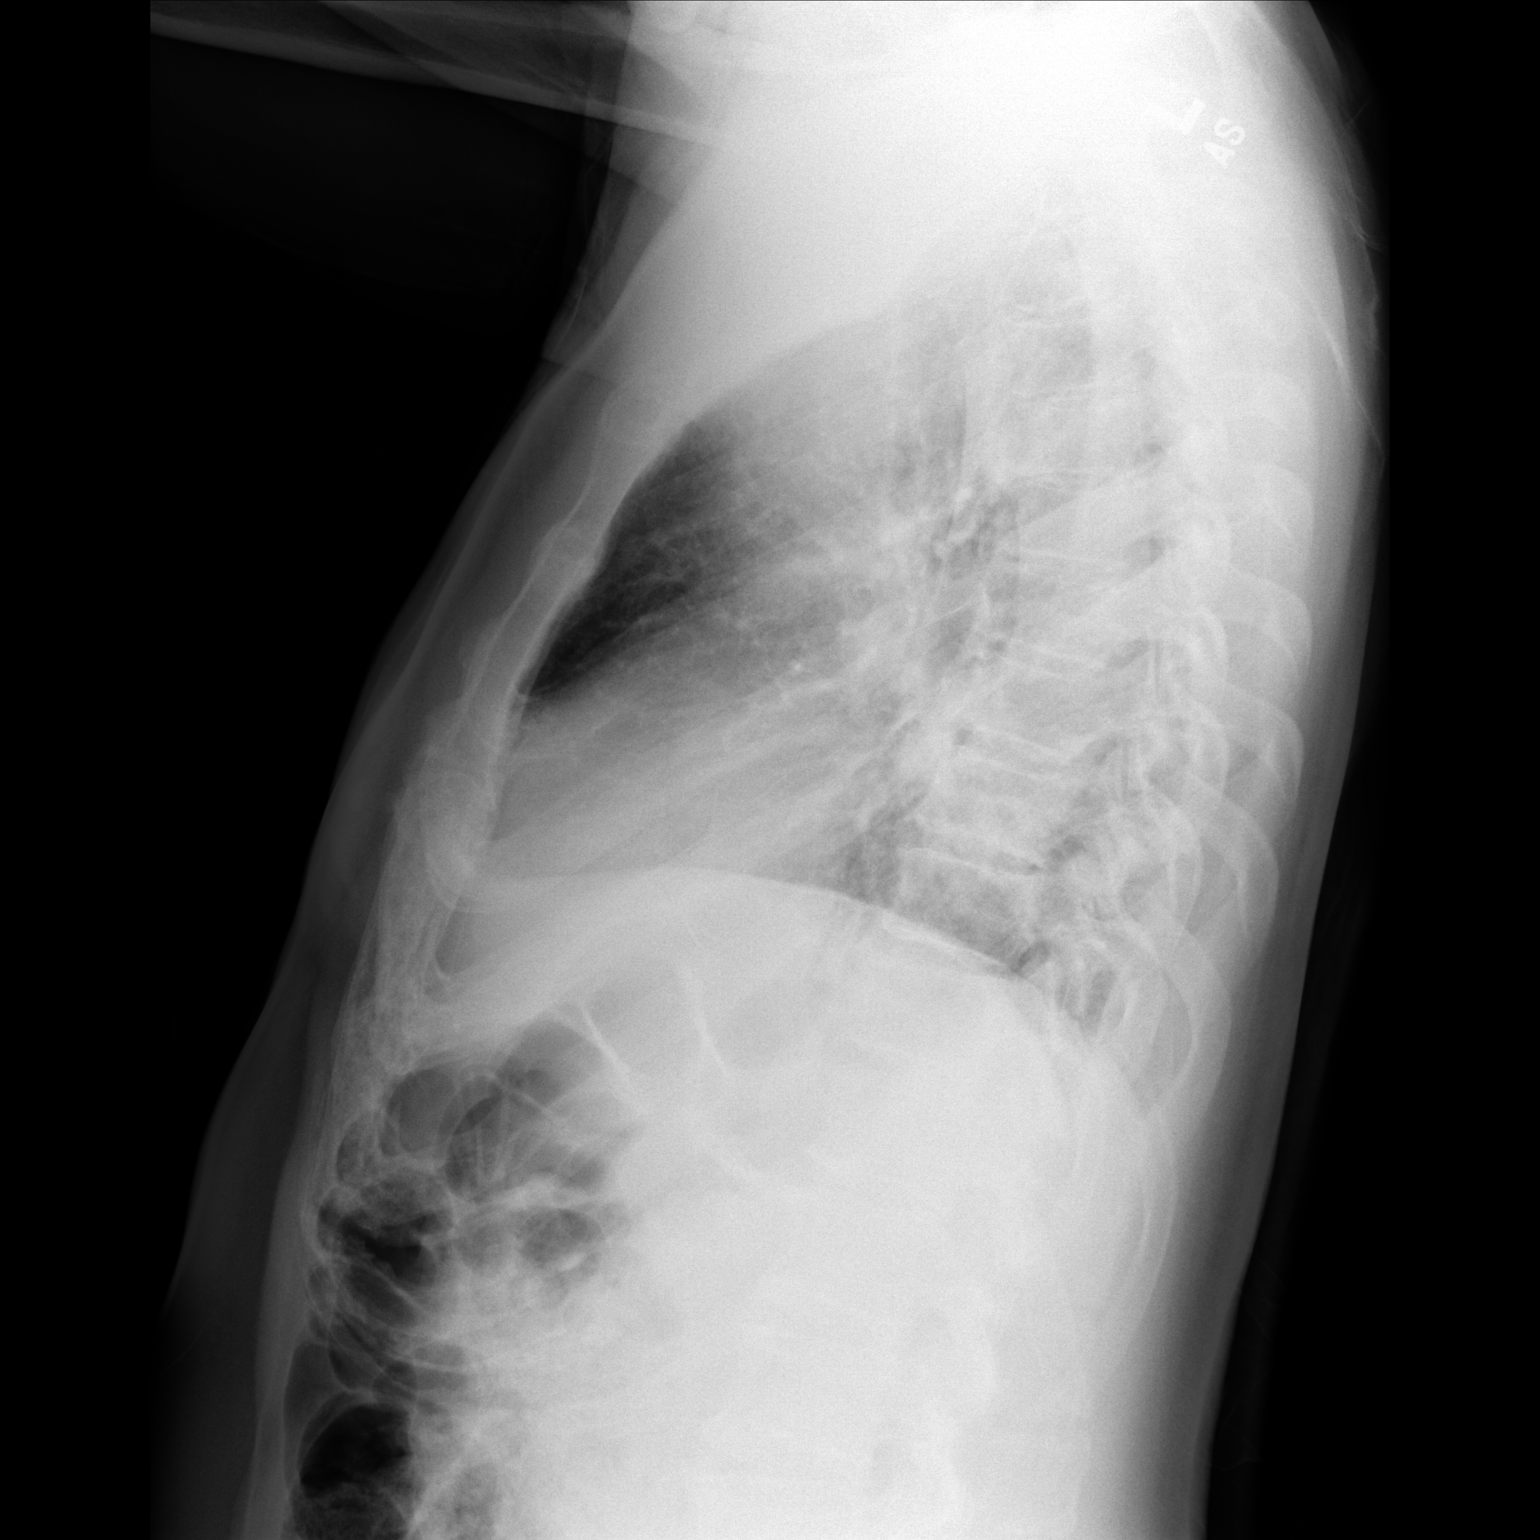

[2 of 2 positions shown; findings below may reference images not displayed]

FINDINGS: The cardiomediastinal silhouette is unchanged.

Probable left lower lobe airspace disease may represent pneumonia.

There is no evidence of pulmonary edema, suspicious pulmonary
nodule/mass, pleural effusion, or pneumothorax. No acute bony
abnormalities are identified.
IMPRESSION: Probable left lower lobe airspace disease suggesting pneumonia.
Radiographic follow-up to resolution recommended.

## 2018-04-07 ENCOUNTER — Ambulatory Visit: Payer: Medicare Other | Admitting: Nurse Practitioner

## 2018-05-09 ENCOUNTER — Other Ambulatory Visit: Payer: Self-pay | Admitting: Nurse Practitioner

## 2018-05-09 DIAGNOSIS — I10 Essential (primary) hypertension: Secondary | ICD-10-CM

## 2018-05-10 DIAGNOSIS — R140 Abdominal distension (gaseous): Secondary | ICD-10-CM | POA: Diagnosis not present

## 2018-05-10 DIAGNOSIS — Z1159 Encounter for screening for other viral diseases: Secondary | ICD-10-CM | POA: Diagnosis not present

## 2018-05-10 DIAGNOSIS — K562 Volvulus: Secondary | ICD-10-CM | POA: Diagnosis not present

## 2018-05-10 DIAGNOSIS — Z87891 Personal history of nicotine dependence: Secondary | ICD-10-CM | POA: Diagnosis not present

## 2018-05-10 DIAGNOSIS — Z79899 Other long term (current) drug therapy: Secondary | ICD-10-CM | POA: Diagnosis not present

## 2018-05-10 DIAGNOSIS — R451 Restlessness and agitation: Secondary | ICD-10-CM | POA: Diagnosis not present

## 2018-05-10 DIAGNOSIS — R509 Fever, unspecified: Secondary | ICD-10-CM | POA: Diagnosis not present

## 2018-05-10 DIAGNOSIS — G309 Alzheimer's disease, unspecified: Secondary | ICD-10-CM | POA: Diagnosis not present

## 2018-05-10 DIAGNOSIS — N281 Cyst of kidney, acquired: Secondary | ICD-10-CM | POA: Diagnosis not present

## 2018-05-10 DIAGNOSIS — R066 Hiccough: Secondary | ICD-10-CM | POA: Diagnosis not present

## 2018-05-10 DIAGNOSIS — K5900 Constipation, unspecified: Secondary | ICD-10-CM | POA: Diagnosis not present

## 2018-05-10 DIAGNOSIS — I10 Essential (primary) hypertension: Secondary | ICD-10-CM | POA: Diagnosis not present

## 2018-05-13 DIAGNOSIS — K562 Volvulus: Secondary | ICD-10-CM | POA: Diagnosis not present

## 2018-05-13 DIAGNOSIS — J189 Pneumonia, unspecified organism: Secondary | ICD-10-CM | POA: Diagnosis not present

## 2018-05-13 DIAGNOSIS — Z7189 Other specified counseling: Secondary | ICD-10-CM | POA: Diagnosis not present

## 2018-05-13 DIAGNOSIS — R748 Abnormal levels of other serum enzymes: Secondary | ICD-10-CM | POA: Diagnosis not present

## 2018-05-13 DIAGNOSIS — K529 Noninfective gastroenteritis and colitis, unspecified: Secondary | ICD-10-CM | POA: Diagnosis not present

## 2018-05-13 DIAGNOSIS — M79673 Pain in unspecified foot: Secondary | ICD-10-CM | POA: Diagnosis not present

## 2018-05-13 DIAGNOSIS — G9341 Metabolic encephalopathy: Secondary | ICD-10-CM | POA: Diagnosis not present

## 2018-05-13 DIAGNOSIS — J9811 Atelectasis: Secondary | ICD-10-CM | POA: Diagnosis not present

## 2018-05-13 DIAGNOSIS — R5381 Other malaise: Secondary | ICD-10-CM | POA: Diagnosis not present

## 2018-05-13 DIAGNOSIS — I10 Essential (primary) hypertension: Secondary | ICD-10-CM | POA: Diagnosis not present

## 2018-05-13 DIAGNOSIS — R1310 Dysphagia, unspecified: Secondary | ICD-10-CM | POA: Diagnosis not present

## 2018-05-13 DIAGNOSIS — E441 Mild protein-calorie malnutrition: Secondary | ICD-10-CM | POA: Diagnosis not present

## 2018-05-13 DIAGNOSIS — R918 Other nonspecific abnormal finding of lung field: Secondary | ICD-10-CM | POA: Diagnosis not present

## 2018-05-13 DIAGNOSIS — G309 Alzheimer's disease, unspecified: Secondary | ICD-10-CM | POA: Diagnosis not present

## 2018-05-13 DIAGNOSIS — E876 Hypokalemia: Secondary | ICD-10-CM | POA: Diagnosis not present

## 2018-05-13 DIAGNOSIS — K6389 Other specified diseases of intestine: Secondary | ICD-10-CM | POA: Diagnosis not present

## 2018-05-13 DIAGNOSIS — M6282 Rhabdomyolysis: Secondary | ICD-10-CM | POA: Diagnosis not present

## 2018-05-13 DIAGNOSIS — R140 Abdominal distension (gaseous): Secondary | ICD-10-CM | POA: Diagnosis not present

## 2018-05-13 DIAGNOSIS — R05 Cough: Secondary | ICD-10-CM | POA: Diagnosis not present

## 2018-05-13 DIAGNOSIS — R451 Restlessness and agitation: Secondary | ICD-10-CM | POA: Diagnosis not present

## 2018-05-13 DIAGNOSIS — I803 Phlebitis and thrombophlebitis of lower extremities, unspecified: Secondary | ICD-10-CM | POA: Diagnosis not present

## 2018-05-13 DIAGNOSIS — R2242 Localized swelling, mass and lump, left lower limb: Secondary | ICD-10-CM | POA: Diagnosis not present

## 2018-05-13 DIAGNOSIS — Z79899 Other long term (current) drug therapy: Secondary | ICD-10-CM | POA: Diagnosis not present

## 2018-05-13 DIAGNOSIS — K5900 Constipation, unspecified: Secondary | ICD-10-CM | POA: Diagnosis not present

## 2018-05-13 DIAGNOSIS — Z4682 Encounter for fitting and adjustment of non-vascular catheter: Secondary | ICD-10-CM | POA: Diagnosis not present

## 2018-05-13 DIAGNOSIS — N183 Chronic kidney disease, stage 3 (moderate): Secondary | ICD-10-CM | POA: Diagnosis not present

## 2018-05-13 DIAGNOSIS — Z515 Encounter for palliative care: Secondary | ICD-10-CM | POA: Diagnosis not present

## 2018-05-13 DIAGNOSIS — I129 Hypertensive chronic kidney disease with stage 1 through stage 4 chronic kidney disease, or unspecified chronic kidney disease: Secondary | ICD-10-CM | POA: Diagnosis not present

## 2018-05-13 DIAGNOSIS — R1312 Dysphagia, oropharyngeal phase: Secondary | ICD-10-CM | POA: Diagnosis not present

## 2018-05-13 DIAGNOSIS — Z66 Do not resuscitate: Secondary | ICD-10-CM | POA: Diagnosis not present

## 2018-05-13 DIAGNOSIS — R262 Difficulty in walking, not elsewhere classified: Secondary | ICD-10-CM | POA: Diagnosis not present

## 2018-05-13 DIAGNOSIS — R531 Weakness: Secondary | ICD-10-CM | POA: Diagnosis not present

## 2018-05-13 DIAGNOSIS — J690 Pneumonitis due to inhalation of food and vomit: Secondary | ICD-10-CM | POA: Diagnosis not present

## 2018-05-13 DIAGNOSIS — N481 Balanitis: Secondary | ICD-10-CM | POA: Diagnosis not present

## 2018-05-13 DIAGNOSIS — L03116 Cellulitis of left lower limb: Secondary | ICD-10-CM | POA: Diagnosis not present

## 2018-05-13 DIAGNOSIS — D696 Thrombocytopenia, unspecified: Secondary | ICD-10-CM | POA: Diagnosis not present

## 2018-05-13 MED ORDER — ENOXAPARIN SODIUM 30 MG/0.3ML ~~LOC~~ SOLN
30.00 | SUBCUTANEOUS | Status: DC
Start: 2018-05-14 — End: 2018-05-13

## 2018-05-13 MED ORDER — HYDROCHLOROTHIAZIDE 12.5 MG PO TABS
12.50 | ORAL_TABLET | ORAL | Status: DC
Start: 2018-05-14 — End: 2018-05-13

## 2018-05-13 MED ORDER — SERTRALINE HCL 50 MG PO TABS
50.00 | ORAL_TABLET | ORAL | Status: DC
Start: 2018-05-14 — End: 2018-05-13

## 2018-05-13 MED ORDER — GENERIC EXTERNAL MEDICATION
Status: DC
Start: ? — End: 2018-05-13

## 2018-05-13 MED ORDER — HALOPERIDOL LACTATE 5 MG/ML IJ SOLN
5.00 | INTRAMUSCULAR | Status: DC
Start: ? — End: 2018-05-13

## 2018-05-13 MED ORDER — SODIUM CHLORIDE 0.9 % IV SOLN
10.00 | INTRAVENOUS | Status: DC
Start: ? — End: 2018-05-13

## 2018-05-13 MED ORDER — POLYETHYLENE GLYCOL 3350 17 G PO PACK
17.00 | PACK | ORAL | Status: DC
Start: 2018-05-13 — End: 2018-05-13

## 2018-05-13 MED ORDER — HYDRALAZINE HCL 20 MG/ML IJ SOLN
5.00 | INTRAMUSCULAR | Status: DC
Start: ? — End: 2018-05-13

## 2018-05-14 DIAGNOSIS — G309 Alzheimer's disease, unspecified: Secondary | ICD-10-CM | POA: Diagnosis not present

## 2018-05-14 DIAGNOSIS — E876 Hypokalemia: Secondary | ICD-10-CM | POA: Diagnosis not present

## 2018-05-14 DIAGNOSIS — I10 Essential (primary) hypertension: Secondary | ICD-10-CM | POA: Diagnosis not present

## 2018-05-14 DIAGNOSIS — R748 Abnormal levels of other serum enzymes: Secondary | ICD-10-CM | POA: Diagnosis not present

## 2018-05-14 DIAGNOSIS — L03116 Cellulitis of left lower limb: Secondary | ICD-10-CM | POA: Diagnosis not present

## 2018-05-16 ENCOUNTER — Telehealth: Payer: Self-pay

## 2018-05-16 NOTE — Telephone Encounter (Signed)
Patient's wife called stating he ended up back in the hospital because he was unable to walk on his left foot she thought he had a blood clot so they admitted him again and ruled out a blood clot he has cellulitis and he currently on antibotics she stated the pt is still in the hospital I advised her to give [place] a call so we can schedule a virtual hospital f/u when he is discharged. YRL,RMA

## 2018-05-17 DIAGNOSIS — L03116 Cellulitis of left lower limb: Secondary | ICD-10-CM | POA: Diagnosis not present

## 2018-06-06 ENCOUNTER — Telehealth: Payer: Self-pay

## 2018-06-06 NOTE — Telephone Encounter (Signed)
Patient's wife called to cancel appointment because he is still in the hospital she stated she will back to reschedule once he is released. YRL,RMA

## 2018-06-08 ENCOUNTER — Ambulatory Visit: Payer: Medicare Other | Admitting: Nurse Practitioner

## 2018-06-20 ENCOUNTER — Ambulatory Visit: Payer: Self-pay

## 2018-06-20 DIAGNOSIS — F0391 Unspecified dementia with behavioral disturbance: Secondary | ICD-10-CM

## 2018-06-20 DIAGNOSIS — I10 Essential (primary) hypertension: Secondary | ICD-10-CM

## 2018-06-20 NOTE — Chronic Care Management (AMB) (Signed)
  Chronic Care Management   Telephone Outreach Note  06/20/2018 Name: Donald Garrett MRN: [id number] DOB: 1940-10-31  Referred by: patient's health plan.   I reached out to Mr. Johna Roles today by phone in response to a referral sent by Mr. Korey L Luty's health plan. I spoke with the patients wife, Quency Tober. [name] and I briefly discussed care management needs related to Dementia and recent hospitalization (approximately 1 month).  [name] was given information about Chronic Care Management services today including:  1. CCM service includes personalized support from designated clinical staff supervised by his physician, including individualized plan of care and coordination with other care providers 2. 24/7 contact phone numbers for assistance for urgent and routine care needs. 3. Service will only be billed when office clinical staff spend 20 minutes or more in a month to coordinate care. 4. Only one practitioner may furnish and bill the service in a calendar month. 5. The patient may stop CCM services at any time (effective at the end of the month) by phone call to the office staff. 6. The patient will be responsible for cost sharing (co-pay) of up to 20% of the service fee (after annual deductible is met).  [name] did not agree and does not wish to consider at this time. [name] reports the patient is involved with home health services and has received a bedside commode as well as a walker. 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 The patient is reportedly not wanting to eat much and requiring a pureed diet. [name] does not feel the patient has any chronic care management needs at this time that she is not already receiving assistance for through home health. CCM SW encouraged [name] to contact the patients primary provider if she changes her mind.  No further follow up required:  [name] understands CCM services are available and will contact the patients provider if she would like a referral at a later date.   [name], FNP has been notified of this outreach and [name]'s decision and plan.   Daneen Schick, BSW, CDP Social Worker, Certified Dementia Practitioner [facility] [phone]

## 2018-06-21 ENCOUNTER — Telehealth: Payer: Self-pay

## 2018-06-21 NOTE — Telephone Encounter (Signed)
Transition Care Management Follow-up Telephone Call  Date of discharge and from where: [facility]   How have you been since you were released from the hospital? 06/16/18  Any questions or concerns? She is concerned that pt has all the secretions that are difficult to manage  Items Reviewed:  Did the pt receive and understand the discharge instructions provided? yes  Medications obtained and verified? yes  Any new allergies since your discharge? no  Dietary orders reviewed? puried diet   Do you have support at home? Yes his wife   Other (ie: DME, Home Health, etc) not anymore  Functional Questionnaire: (I = Independent and D = Dependent) ADL's: D  Bathing/Dressing- D   Meal Prep- D  Eating- D  Maintaining continence- D  Transferring/Ambulation- D  Managing Meds- D   Follow up appointments reviewed:    PCP Hospital f/u appt confirmed? YESScheduled to see [name] FNP-BC on 06/22/18 @ 3:30pm  Specialist Hospital f/u appt confirmed? no   Are transportation arrangements needed? no  If their condition worsens, is the pt aware to call  their PCP or go to the ED? yes  Was the patient provided with contact information for the PCP's office or ED?  yes  Was the pt encouraged to call back with questions or concerns? yes

## 2018-06-22 ENCOUNTER — Ambulatory Visit (INDEPENDENT_AMBULATORY_CARE_PROVIDER_SITE_OTHER): Payer: Medicare Other | Admitting: Nurse Practitioner

## 2018-06-22 ENCOUNTER — Other Ambulatory Visit: Payer: Self-pay

## 2018-06-22 ENCOUNTER — Encounter: Payer: Self-pay | Admitting: Nurse Practitioner

## 2018-06-22 DIAGNOSIS — R269 Unspecified abnormalities of gait and mobility: Secondary | ICD-10-CM

## 2018-06-22 DIAGNOSIS — R1310 Dysphagia, unspecified: Secondary | ICD-10-CM

## 2018-06-22 DIAGNOSIS — G301 Alzheimer's disease with late onset: Secondary | ICD-10-CM

## 2018-06-22 DIAGNOSIS — J690 Pneumonitis due to inhalation of food and vomit: Secondary | ICD-10-CM

## 2018-06-22 DIAGNOSIS — I10 Essential (primary) hypertension: Secondary | ICD-10-CM

## 2018-06-22 DIAGNOSIS — Z09 Encounter for follow-up examination after completed treatment for conditions other than malignant neoplasm: Secondary | ICD-10-CM

## 2018-06-22 DIAGNOSIS — K562 Volvulus: Secondary | ICD-10-CM

## 2018-06-22 DIAGNOSIS — F02818 Dementia in other diseases classified elsewhere, unspecified severity, with other behavioral disturbance: Secondary | ICD-10-CM

## 2018-06-22 DIAGNOSIS — F0281 Dementia in other diseases classified elsewhere with behavioral disturbance: Secondary | ICD-10-CM

## 2018-06-22 MED ORDER — ENALAPRIL MALEATE 10 MG PO TABS: 10.0000 mg | ORAL_TABLET | Freq: Every day | ORAL | 1 refills | Status: AC

## 2018-06-22 NOTE — Progress Notes (Addendum)
Virtual Visit via Video   This visit type was conducted due to national recommendations for restrictions regarding the COVID-19 Pandemic (e.g. social distancing) in an effort to limit this patient's exposure and mitigate transmission in our community.  Patients identity confirmed using two different identifiers.  This format is felt to be most appropriate for this patient at this time.  All issues noted in this document were discussed and addressed.  No physical exam was performed (except for noted visual exam findings with Video Visits).    Date:  06/22/2018   ID:  Donald Garrett, DOB 1940/09/01, MRN [id number]  Patient Location:  Home - spoke with his wife Donald Garrett  Provider location:   Office    Chief Complaint:  Hospital follow up  History of Present Illness:    Donald ChessmanChester L Garrett is a 78 y.o. male who presents via video conferencing for a telehealth visit today.    The patient does not have symptoms concerning for COVID-19 infection (fever, chills, cough, or new shortness of breath).   Visit for hospital admission follow up 6/5- 6/8 for bowel obstruction.  He began limping and his left leg was hot went [facility] 6[facility]8 [facility].  When he went the last time untwisted his bowels.  She would like to be conservative with the treatment - OT/PT wasn't candidate.  Blood cultures came back negative for cellulitis.  Still had mucous in his stool.  He was moved to stepdown with soap suds, enema.  He was given NGT with tube feedings.  He also had magnesium citrate x 2.  He was receiving ativan and aspiration pneumonia during the visit.    Since being home he has had multiple bowel movements - has brief.  He is on miralax and senekot.  Had large bowel movement yesterday in the brief and bedside commode.  He has had a nurse to come out to see him - needs a hospital bed.    Cardiac pureed diet.  She has saliva out of his mouth.         Past Medical History:  Diagnosis Date   . Alzheimer's disease (HCC)   . GERD (gastroesophageal reflux disease)   . Glaucoma   . Hypertension    History reviewed. No pertinent surgical history.   Current Meds  Medication Sig  . ascorbic acid (VITAMIN C) 500 MG/5ML syrup Take 1,000 mg by mouth daily.  Marland Kitchen. b complex-vitamin c-folic acid (NEPHRO-VITE) 0.8 MG TABS tablet Take 1 tablet by mouth at bedtime.  . clotrimazole (LOTRIMIN) 1 % cream Apply 1 application topically 2 (two) times daily. Apple to effected area  . hydrochlorothiazide (HYDRODIURIL) 12.5 MG tablet TAKE 1 TABLET(12.5 MG) BY MOUTH DAILY  . Melatonin 3 MG TABS Take by mouth.  . Polyethylene Glycol 3350 (MIRALAX PO) Take 1 packet by mouth daily.  . Probiotic Product (PROBIOTIC-10 PO) Take 1 tablet by mouth daily.  . sennosides-docusate sodium (SENOKOT-S) 8.6-50 MG tablet Take 1 tablet by mouth daily.  . sertraline (ZOLOFT) 50 MG tablet Take by mouth.  . Simethicone 40 MG/0.6ML LIQD Take 0.6 mg by mouth 4 (four) times daily.  . [DISCONTINUED] lisinopril (ZESTRIL) 10 MG tablet Take 10 mg by mouth daily.     Allergies:   Patient has no known allergies.   Social History   Tobacco Use  . Smoking status: Former Games developermoker  . Smokeless tobacco: Former Engineer, waterUser  Substance Use Topics  . Alcohol use: Never  Frequency: Never  . Drug use: Never     Family Hx: The patient's family history is not on file.  ROS:   Please see the history of present illness.    Review of Systems  Reason unable to perform ROS: he is unable to communicate, end stage Alzheimers disease.  Psychiatric/Behavioral: Negative for depression.    All other systems reviewed and are negative.   Labs/Other Tests and Data Reviewed:    Recent Labs: 07/05/2017: BUN 11; Creatinine 1.1; Hemoglobin 11.8; Platelets 113; Potassium 3.1; Sodium 145; TSH 1.13   Recent Lipid Panel Lab Results  Component Value Date/Time   CHOL 118 07/05/2017 12:15 PM   TRIG 46 07/05/2017 12:15 PM   HDL 48 07/05/2017 12:15 PM    LDLCALC 61 07/05/2017 12:15 PM    Wt Readings from Last 3 Encounters:  01/12/18 158 lb (71.7 kg)  10/28/17 145 lb (65.8 kg)  10/06/17 143 lb 12.8 oz (65.2 kg)     Exam:    Vital Signs:  There were no vitals taken for this visit.    Physical Exam  Constitutional: No distress.  Mild temporal wasting bilaterally, he is thinning  Neurological:  He is holding his head down and does not open his eyes during his visit, slight drooling.  His wife provided the information  Psychiatric: His affect is labile. He has a flat affect.  Flat, noncommunicative    ASSESSMENT & PLAN:    1. Late onset Alzheimer's disease with behavioral disturbance [facility](HCC) He is declining now he is on a pureed cardiac diet, has temporal wasting bilaterally, continues to keep his eyes closed.  He is noncommunicative and is now drooling primarily when in bed.  He is requiring a hospital bed due to the drooling and the wife is having more difficulty with getting him out of the regular bed.  Due to late onset alzheimers patient requires frequent changes in body position.  The patient also requires the head of the bed to be elevated more than 30 degrees most of the time due to problems with aspiration I will make a referral to palliative care for goals of care and he may be eligible for hospice care.  I have discussed with the wife the difference in palliative vs hospice.  She is needing more help with his ADL's as well He had two hospitalizations in the last 2 months one for bowel obstruction the other for cellulitis of his lower extremity TCM Performed. A member of the clinical team spoke with the patient upon dischare. Discharge summary was reviewed in full detail during the visit. Meds reconciled and compared to discharge meds. Medication list is updated and reviewed with the patient.  Greater than 50% face to face time was spent in counseling an coordination of care.  All questions were answered to the satisfaction of the  patient.   - Amb Referral to Palliative Care  2. Essential hypertension  This has been elevated while he was in the hospital  Now on enalapril daily and HCTZ  Poorly controlled - enalapril (VASOTEC) 10 MG tablet; Take 1 tablet (10 mg total) by mouth daily.  Dispense: 90 tablet; Refill: 1  3. Gait disturbance  Requiring the use of a walker when ambulating   She is waiting for a wheelchair that will have the legs long enough for him  Needs assistance with ADL's and the need for a hospital bed.   4. Sigmoid volvulus (HCC)  No surgical intervention he has been receiving enemas  and miralax  He also received magnesium citrate a couple of times  Since being home he has been having good bowel movements  5. Dysphagia, unspecified type  Since his last hospitalization 6/9-6/11 he had an NGT placed for feeding and since that time he has been unable to swallow.    6. Aspiration pneumonia of lower lobe, unspecified aspiration pneumonia type, unspecified laterality ([place])  Was treated with Zosyn during most recent hospitalization  COVID-19 Education: The signs and symptoms of COVID-19 were discussed with the patient and how to seek care for testing (follow up with PCP or arrange E-visit).  The importance of social distancing was discussed today.  Patient Risk:   After full review of this patients clinical status, I feel that they are at least moderate risk at this time.  Time:   Today, I have spent 33 minutes/ seconds with the patient with telehealth technology discussing above diagnoses.     Medication Adjustments/Labs and Tests Ordered: Current medicines are reviewed at length with the patient today.  Concerns regarding medicines are outlined above.   Tests Ordered: Orders Placed This Encounter  Procedures  . Amb Referral to Palliative Care    Medication Changes: Meds ordered this encounter  Medications  . enalapril (VASOTEC) 10 MG tablet    Sig: Take 1 tablet (10 mg  total) by mouth daily.    Dispense:  90 tablet    Refill:  1    Disposition:  Follow up in 3 month(s)  Signed, [name], FNP

## 2018-06-25 DIAGNOSIS — R269 Unspecified abnormalities of gait and mobility: Secondary | ICD-10-CM | POA: Diagnosis not present

## 2018-06-25 DIAGNOSIS — G301 Alzheimer's disease with late onset: Secondary | ICD-10-CM | POA: Diagnosis not present

## 2018-06-25 DIAGNOSIS — R1310 Dysphagia, unspecified: Secondary | ICD-10-CM | POA: Diagnosis not present

## 2018-06-27 ENCOUNTER — Telehealth: Payer: Self-pay | Admitting: Nurse Practitioner

## 2018-06-27 NOTE — Telephone Encounter (Signed)
Spoke with Mrs. Barrales who tells me the patient is not eating and drinking well.  Has only had sherbert.  He had an episode this weekend where he was vomiting until he had a large bowel movement and he was better.  I discussed with her about having a PEG and the risk for aspiration and these symptoms and signs he is having sounds like this is related to disease process and is likely transitioning to end of life.  She is receptive to having a Hospice Evaluation, I spoke with Maureen Ralphs at [facility] who is going to convert the order for Palliative Care to Hospice Evaluation as a verbal order.  I will have Trellis to be primary and I follow secondary.

## 2018-07-28 DIAGNOSIS — K562 Volvulus: Secondary | ICD-10-CM | POA: Diagnosis not present

## 2018-07-28 DIAGNOSIS — Z87891 Personal history of nicotine dependence: Secondary | ICD-10-CM | POA: Diagnosis not present

## 2018-07-28 DIAGNOSIS — E876 Hypokalemia: Secondary | ICD-10-CM | POA: Diagnosis not present

## 2018-07-28 DIAGNOSIS — Z7189 Other specified counseling: Secondary | ICD-10-CM | POA: Diagnosis not present

## 2018-07-28 DIAGNOSIS — R109 Unspecified abdominal pain: Secondary | ICD-10-CM | POA: Diagnosis not present

## 2018-07-28 DIAGNOSIS — N183 Chronic kidney disease, stage 3 (moderate): Secondary | ICD-10-CM | POA: Diagnosis not present

## 2018-07-28 DIAGNOSIS — R1084 Generalized abdominal pain: Secondary | ICD-10-CM | POA: Diagnosis not present

## 2018-07-28 DIAGNOSIS — R531 Weakness: Secondary | ICD-10-CM | POA: Diagnosis not present

## 2018-07-28 DIAGNOSIS — Z515 Encounter for palliative care: Secondary | ICD-10-CM | POA: Diagnosis not present

## 2018-07-28 DIAGNOSIS — K5900 Constipation, unspecified: Secondary | ICD-10-CM | POA: Diagnosis not present

## 2018-07-28 DIAGNOSIS — E870 Hyperosmolality and hypernatremia: Secondary | ICD-10-CM | POA: Diagnosis not present

## 2018-07-28 DIAGNOSIS — Z6821 Body mass index (BMI) 21.0-21.9, adult: Secondary | ICD-10-CM | POA: Diagnosis not present

## 2018-07-28 DIAGNOSIS — Z66 Do not resuscitate: Secondary | ICD-10-CM | POA: Diagnosis not present

## 2018-07-28 DIAGNOSIS — N179 Acute kidney failure, unspecified: Secondary | ICD-10-CM | POA: Diagnosis not present

## 2018-07-28 DIAGNOSIS — R5381 Other malaise: Secondary | ICD-10-CM | POA: Diagnosis not present

## 2018-07-28 DIAGNOSIS — E440 Moderate protein-calorie malnutrition: Secondary | ICD-10-CM | POA: Diagnosis not present

## 2018-07-28 DIAGNOSIS — I129 Hypertensive chronic kidney disease with stage 1 through stage 4 chronic kidney disease, or unspecified chronic kidney disease: Secondary | ICD-10-CM | POA: Diagnosis not present

## 2018-07-28 DIAGNOSIS — R1312 Dysphagia, oropharyngeal phase: Secondary | ICD-10-CM | POA: Diagnosis not present

## 2018-07-28 DIAGNOSIS — Z79899 Other long term (current) drug therapy: Secondary | ICD-10-CM | POA: Diagnosis not present

## 2018-07-28 DIAGNOSIS — R579 Shock, unspecified: Secondary | ICD-10-CM | POA: Diagnosis not present

## 2018-07-28 DIAGNOSIS — G309 Alzheimer's disease, unspecified: Secondary | ICD-10-CM | POA: Diagnosis not present

## 2018-07-28 DIAGNOSIS — L03115 Cellulitis of right lower limb: Secondary | ICD-10-CM | POA: Diagnosis not present

## 2018-07-28 DIAGNOSIS — R140 Abdominal distension (gaseous): Secondary | ICD-10-CM | POA: Diagnosis not present

## 2018-07-28 DIAGNOSIS — R933 Abnormal findings on diagnostic imaging of other parts of digestive tract: Secondary | ICD-10-CM | POA: Diagnosis not present

## 2018-07-28 DIAGNOSIS — I10 Essential (primary) hypertension: Secondary | ICD-10-CM | POA: Diagnosis not present

## 2018-07-28 DIAGNOSIS — E860 Dehydration: Secondary | ICD-10-CM | POA: Diagnosis not present

## 2018-07-28 DIAGNOSIS — K56609 Unspecified intestinal obstruction, unspecified as to partial versus complete obstruction: Secondary | ICD-10-CM | POA: Diagnosis not present

## 2018-07-28 DIAGNOSIS — K6389 Other specified diseases of intestine: Secondary | ICD-10-CM | POA: Diagnosis not present

## 2018-08-25 ENCOUNTER — Telehealth: Payer: Self-pay

## 2018-08-25 NOTE — Telephone Encounter (Signed)
Patient's wife Donald Garrett called stating she is having some difficulities with the social services admin. They are trying to make her a pave for pt. 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 She doesn't know how to spell your full name. She stated it is ok to email or text her in the info. [phone]  RETURNED HER CALL AND PROVIDED HER WITH THE SPELLING OF Donald Garrett'S NAME AND OUR ADDRESS. SHE STATED THAT THEY WOULD BE GIVING [place] A CALL TO VERIFY THIS INFO I ADVISED HER TO LET THEM KNOW THAT WE ARE NOW CLOSED ON FRIDAYS. Lonia Mad

## 2018-09-07 DIAGNOSIS — K562 Volvulus: Secondary | ICD-10-CM | POA: Diagnosis not present

## 2018-09-07 DIAGNOSIS — R933 Abnormal findings on diagnostic imaging of other parts of digestive tract: Secondary | ICD-10-CM | POA: Diagnosis not present

## 2018-09-07 DIAGNOSIS — R109 Unspecified abdominal pain: Secondary | ICD-10-CM | POA: Diagnosis not present

## 2018-09-07 DIAGNOSIS — R140 Abdominal distension (gaseous): Secondary | ICD-10-CM | POA: Diagnosis not present

## 2018-09-07 DIAGNOSIS — K5900 Constipation, unspecified: Secondary | ICD-10-CM | POA: Diagnosis not present

## 2018-09-08 DIAGNOSIS — K562 Volvulus: Secondary | ICD-10-CM | POA: Diagnosis not present

## 2019-01-18 ENCOUNTER — Ambulatory Visit: Payer: Medicare Other

## 2019-01-18 ENCOUNTER — Encounter: Payer: Medicare Other | Admitting: Nurse Practitioner

## 2019-06-08 DIAGNOSIS — J690 Pneumonitis due to inhalation of food and vomit: Secondary | ICD-10-CM | POA: Diagnosis not present

## 2019-06-08 DIAGNOSIS — R000 Tachycardia, unspecified: Secondary | ICD-10-CM | POA: Diagnosis not present

## 2019-06-09 DIAGNOSIS — Z9049 Acquired absence of other specified parts of digestive tract: Secondary | ICD-10-CM | POA: Diagnosis not present

## 2019-06-09 DIAGNOSIS — J690 Pneumonitis due to inhalation of food and vomit: Secondary | ICD-10-CM | POA: Diagnosis not present

## 2019-06-09 DIAGNOSIS — K253 Acute gastric ulcer without hemorrhage or perforation: Secondary | ICD-10-CM | POA: Diagnosis not present

## 2019-06-09 DIAGNOSIS — D539 Nutritional anemia, unspecified: Secondary | ICD-10-CM | POA: Diagnosis not present

## 2019-06-09 DIAGNOSIS — K311 Adult hypertrophic pyloric stenosis: Secondary | ICD-10-CM | POA: Diagnosis not present

## 2019-06-11 DIAGNOSIS — K311 Adult hypertrophic pyloric stenosis: Secondary | ICD-10-CM | POA: Diagnosis not present

## 2019-06-11 DIAGNOSIS — J690 Pneumonitis due to inhalation of food and vomit: Secondary | ICD-10-CM | POA: Diagnosis not present

## 2019-06-12 DIAGNOSIS — K31811 Angiodysplasia of stomach and duodenum with bleeding: Secondary | ICD-10-CM | POA: Diagnosis not present

## 2019-06-12 DIAGNOSIS — J690 Pneumonitis due to inhalation of food and vomit: Secondary | ICD-10-CM | POA: Diagnosis not present

## 2019-06-12 DIAGNOSIS — K259 Gastric ulcer, unspecified as acute or chronic, without hemorrhage or perforation: Secondary | ICD-10-CM | POA: Diagnosis not present

## 2019-06-12 DIAGNOSIS — D539 Nutritional anemia, unspecified: Secondary | ICD-10-CM | POA: Diagnosis not present

## 2019-06-12 DIAGNOSIS — K311 Adult hypertrophic pyloric stenosis: Secondary | ICD-10-CM | POA: Diagnosis not present

## 2019-06-12 DIAGNOSIS — R710 Precipitous drop in hematocrit: Secondary | ICD-10-CM | POA: Diagnosis not present

## 2019-06-13 DIAGNOSIS — K253 Acute gastric ulcer without hemorrhage or perforation: Secondary | ICD-10-CM | POA: Diagnosis not present

## 2019-06-13 DIAGNOSIS — D539 Nutritional anemia, unspecified: Secondary | ICD-10-CM | POA: Diagnosis not present

## 2019-06-13 DIAGNOSIS — Z9049 Acquired absence of other specified parts of digestive tract: Secondary | ICD-10-CM | POA: Diagnosis not present

## 2019-06-13 DIAGNOSIS — E162 Hypoglycemia, unspecified: Secondary | ICD-10-CM | POA: Diagnosis not present

## 2019-06-13 DIAGNOSIS — N179 Acute kidney failure, unspecified: Secondary | ICD-10-CM | POA: Diagnosis not present

## 2019-06-13 DIAGNOSIS — K311 Adult hypertrophic pyloric stenosis: Secondary | ICD-10-CM | POA: Diagnosis not present

## 2019-06-13 DIAGNOSIS — J690 Pneumonitis due to inhalation of food and vomit: Secondary | ICD-10-CM | POA: Diagnosis not present

## 2019-06-14 DIAGNOSIS — E43 Unspecified severe protein-calorie malnutrition: Secondary | ICD-10-CM | POA: Diagnosis not present

## 2019-06-14 DIAGNOSIS — K253 Acute gastric ulcer without hemorrhage or perforation: Secondary | ICD-10-CM | POA: Diagnosis not present

## 2019-06-14 DIAGNOSIS — J690 Pneumonitis due to inhalation of food and vomit: Secondary | ICD-10-CM | POA: Diagnosis not present

## 2019-06-14 DIAGNOSIS — K311 Adult hypertrophic pyloric stenosis: Secondary | ICD-10-CM | POA: Diagnosis not present

## 2019-06-14 DIAGNOSIS — D539 Nutritional anemia, unspecified: Secondary | ICD-10-CM | POA: Diagnosis not present

## 2019-06-15 DIAGNOSIS — E43 Unspecified severe protein-calorie malnutrition: Secondary | ICD-10-CM | POA: Diagnosis not present

## 2019-06-15 DIAGNOSIS — K253 Acute gastric ulcer without hemorrhage or perforation: Secondary | ICD-10-CM | POA: Diagnosis not present

## 2019-06-15 DIAGNOSIS — K311 Adult hypertrophic pyloric stenosis: Secondary | ICD-10-CM | POA: Diagnosis not present

## 2019-06-15 DIAGNOSIS — D539 Nutritional anemia, unspecified: Secondary | ICD-10-CM | POA: Diagnosis not present

## 2019-06-15 DIAGNOSIS — J690 Pneumonitis due to inhalation of food and vomit: Secondary | ICD-10-CM | POA: Diagnosis not present

## 2019-06-16 DIAGNOSIS — K311 Adult hypertrophic pyloric stenosis: Secondary | ICD-10-CM | POA: Diagnosis not present

## 2019-06-16 DIAGNOSIS — E43 Unspecified severe protein-calorie malnutrition: Secondary | ICD-10-CM | POA: Diagnosis not present

## 2019-06-16 DIAGNOSIS — K253 Acute gastric ulcer without hemorrhage or perforation: Secondary | ICD-10-CM | POA: Diagnosis not present

## 2019-06-16 DIAGNOSIS — N1832 Chronic kidney disease, stage 3b: Secondary | ICD-10-CM | POA: Diagnosis not present

## 2019-06-16 DIAGNOSIS — J690 Pneumonitis due to inhalation of food and vomit: Secondary | ICD-10-CM | POA: Diagnosis not present

## 2019-06-16 DIAGNOSIS — D539 Nutritional anemia, unspecified: Secondary | ICD-10-CM | POA: Diagnosis not present

## 2019-06-16 DIAGNOSIS — G9341 Metabolic encephalopathy: Secondary | ICD-10-CM | POA: Diagnosis not present

## 2019-06-17 DIAGNOSIS — D539 Nutritional anemia, unspecified: Secondary | ICD-10-CM | POA: Diagnosis not present

## 2019-06-17 DIAGNOSIS — E43 Unspecified severe protein-calorie malnutrition: Secondary | ICD-10-CM | POA: Diagnosis not present

## 2019-06-17 DIAGNOSIS — J690 Pneumonitis due to inhalation of food and vomit: Secondary | ICD-10-CM | POA: Diagnosis not present

## 2019-06-17 DIAGNOSIS — K311 Adult hypertrophic pyloric stenosis: Secondary | ICD-10-CM | POA: Diagnosis not present

## 2019-06-17 DIAGNOSIS — K253 Acute gastric ulcer without hemorrhage or perforation: Secondary | ICD-10-CM | POA: Diagnosis not present

## 2019-06-18 DIAGNOSIS — E43 Unspecified severe protein-calorie malnutrition: Secondary | ICD-10-CM | POA: Diagnosis not present

## 2019-06-18 DIAGNOSIS — K311 Adult hypertrophic pyloric stenosis: Secondary | ICD-10-CM | POA: Diagnosis not present

## 2019-06-18 DIAGNOSIS — D539 Nutritional anemia, unspecified: Secondary | ICD-10-CM | POA: Diagnosis not present

## 2019-06-18 DIAGNOSIS — J690 Pneumonitis due to inhalation of food and vomit: Secondary | ICD-10-CM | POA: Diagnosis not present

## 2019-06-18 DIAGNOSIS — K253 Acute gastric ulcer without hemorrhage or perforation: Secondary | ICD-10-CM | POA: Diagnosis not present

## 2019-06-19 DIAGNOSIS — Z515 Encounter for palliative care: Secondary | ICD-10-CM | POA: Diagnosis not present

## 2019-06-19 DIAGNOSIS — E43 Unspecified severe protein-calorie malnutrition: Secondary | ICD-10-CM | POA: Diagnosis not present

## 2019-06-19 DIAGNOSIS — K253 Acute gastric ulcer without hemorrhage or perforation: Secondary | ICD-10-CM | POA: Diagnosis not present

## 2019-06-19 DIAGNOSIS — K117 Disturbances of salivary secretion: Secondary | ICD-10-CM | POA: Diagnosis not present

## 2019-06-19 DIAGNOSIS — J690 Pneumonitis due to inhalation of food and vomit: Secondary | ICD-10-CM | POA: Diagnosis not present

## 2019-06-19 DIAGNOSIS — Z7189 Other specified counseling: Secondary | ICD-10-CM | POA: Diagnosis not present

## 2019-06-19 DIAGNOSIS — D539 Nutritional anemia, unspecified: Secondary | ICD-10-CM | POA: Diagnosis not present

## 2019-06-19 DIAGNOSIS — K311 Adult hypertrophic pyloric stenosis: Secondary | ICD-10-CM | POA: Diagnosis not present

## 2019-06-20 DIAGNOSIS — R7989 Other specified abnormal findings of blood chemistry: Secondary | ICD-10-CM | POA: Diagnosis not present

## 2019-06-20 DIAGNOSIS — N179 Acute kidney failure, unspecified: Secondary | ICD-10-CM | POA: Diagnosis not present

## 2019-06-20 DIAGNOSIS — K311 Adult hypertrophic pyloric stenosis: Secondary | ICD-10-CM | POA: Diagnosis not present

## 2019-06-20 DIAGNOSIS — Z515 Encounter for palliative care: Secondary | ICD-10-CM | POA: Diagnosis not present

## 2019-06-20 DIAGNOSIS — Z7189 Other specified counseling: Secondary | ICD-10-CM | POA: Diagnosis not present

## 2019-06-20 DIAGNOSIS — N1832 Chronic kidney disease, stage 3b: Secondary | ICD-10-CM | POA: Diagnosis not present

## 2019-06-20 DIAGNOSIS — J690 Pneumonitis due to inhalation of food and vomit: Secondary | ICD-10-CM | POA: Diagnosis not present

## 2019-06-20 DIAGNOSIS — K117 Disturbances of salivary secretion: Secondary | ICD-10-CM | POA: Diagnosis not present

## 2019-06-21 DIAGNOSIS — R1312 Dysphagia, oropharyngeal phase: Secondary | ICD-10-CM | POA: Diagnosis not present

## 2019-06-21 DIAGNOSIS — Z7189 Other specified counseling: Secondary | ICD-10-CM | POA: Diagnosis not present

## 2019-06-21 DIAGNOSIS — N179 Acute kidney failure, unspecified: Secondary | ICD-10-CM | POA: Diagnosis not present

## 2019-06-21 DIAGNOSIS — R7989 Other specified abnormal findings of blood chemistry: Secondary | ICD-10-CM | POA: Diagnosis not present

## 2019-06-21 DIAGNOSIS — K311 Adult hypertrophic pyloric stenosis: Secondary | ICD-10-CM | POA: Diagnosis not present

## 2019-06-21 DIAGNOSIS — N1832 Chronic kidney disease, stage 3b: Secondary | ICD-10-CM | POA: Diagnosis not present

## 2019-06-21 DIAGNOSIS — G9341 Metabolic encephalopathy: Secondary | ICD-10-CM | POA: Diagnosis not present

## 2019-06-21 DIAGNOSIS — J690 Pneumonitis due to inhalation of food and vomit: Secondary | ICD-10-CM | POA: Diagnosis not present

## 2019-06-22 DIAGNOSIS — J690 Pneumonitis due to inhalation of food and vomit: Secondary | ICD-10-CM | POA: Diagnosis not present

## 2019-06-22 DIAGNOSIS — R7989 Other specified abnormal findings of blood chemistry: Secondary | ICD-10-CM | POA: Diagnosis not present

## 2019-06-22 DIAGNOSIS — N1832 Chronic kidney disease, stage 3b: Secondary | ICD-10-CM | POA: Diagnosis not present

## 2019-06-22 DIAGNOSIS — N179 Acute kidney failure, unspecified: Secondary | ICD-10-CM | POA: Diagnosis not present

## 2019-06-22 DIAGNOSIS — K311 Adult hypertrophic pyloric stenosis: Secondary | ICD-10-CM | POA: Diagnosis not present

## 2019-06-23 DIAGNOSIS — K311 Adult hypertrophic pyloric stenosis: Secondary | ICD-10-CM | POA: Diagnosis not present

## 2019-06-23 DIAGNOSIS — N1832 Chronic kidney disease, stage 3b: Secondary | ICD-10-CM | POA: Diagnosis not present

## 2019-06-23 DIAGNOSIS — R7989 Other specified abnormal findings of blood chemistry: Secondary | ICD-10-CM | POA: Diagnosis not present

## 2019-06-23 DIAGNOSIS — N179 Acute kidney failure, unspecified: Secondary | ICD-10-CM | POA: Diagnosis not present

## 2019-06-23 DIAGNOSIS — J690 Pneumonitis due to inhalation of food and vomit: Secondary | ICD-10-CM | POA: Diagnosis not present

## 2019-06-24 DIAGNOSIS — N1832 Chronic kidney disease, stage 3b: Secondary | ICD-10-CM | POA: Diagnosis not present

## 2019-06-24 DIAGNOSIS — N179 Acute kidney failure, unspecified: Secondary | ICD-10-CM | POA: Diagnosis not present

## 2019-06-24 DIAGNOSIS — K311 Adult hypertrophic pyloric stenosis: Secondary | ICD-10-CM | POA: Diagnosis not present

## 2019-06-24 DIAGNOSIS — J690 Pneumonitis due to inhalation of food and vomit: Secondary | ICD-10-CM | POA: Diagnosis not present

## 2019-06-24 DIAGNOSIS — R7989 Other specified abnormal findings of blood chemistry: Secondary | ICD-10-CM | POA: Diagnosis not present

## 2019-08-24 DIAGNOSIS — R1032 Left lower quadrant pain: Secondary | ICD-10-CM | POA: Diagnosis not present

## 2019-08-24 DIAGNOSIS — R05 Cough: Secondary | ICD-10-CM | POA: Diagnosis not present

## 2019-08-24 DIAGNOSIS — R195 Other fecal abnormalities: Secondary | ICD-10-CM | POA: Diagnosis not present

## 2019-08-24 DIAGNOSIS — R918 Other nonspecific abnormal finding of lung field: Secondary | ICD-10-CM | POA: Diagnosis not present

## 2019-08-24 DIAGNOSIS — K567 Ileus, unspecified: Secondary | ICD-10-CM | POA: Diagnosis not present

## 2019-08-25 DIAGNOSIS — R195 Other fecal abnormalities: Secondary | ICD-10-CM | POA: Diagnosis not present

## 2019-08-25 DIAGNOSIS — R1032 Left lower quadrant pain: Secondary | ICD-10-CM | POA: Diagnosis not present

## 2019-08-25 DIAGNOSIS — R05 Cough: Secondary | ICD-10-CM | POA: Diagnosis not present

## 2019-08-25 DIAGNOSIS — R1312 Dysphagia, oropharyngeal phase: Secondary | ICD-10-CM | POA: Diagnosis not present

## 2019-08-25 DIAGNOSIS — R112 Nausea with vomiting, unspecified: Secondary | ICD-10-CM | POA: Diagnosis not present

## 2019-08-25 DIAGNOSIS — K567 Ileus, unspecified: Secondary | ICD-10-CM | POA: Diagnosis not present

## 2019-08-25 DIAGNOSIS — J690 Pneumonitis due to inhalation of food and vomit: Secondary | ICD-10-CM | POA: Diagnosis not present

## 2019-08-25 DIAGNOSIS — R918 Other nonspecific abnormal finding of lung field: Secondary | ICD-10-CM | POA: Diagnosis not present

## 2019-08-25 DIAGNOSIS — K5900 Constipation, unspecified: Secondary | ICD-10-CM | POA: Diagnosis not present

## 2019-08-26 DIAGNOSIS — K567 Ileus, unspecified: Secondary | ICD-10-CM | POA: Diagnosis not present

## 2019-08-26 DIAGNOSIS — K5900 Constipation, unspecified: Secondary | ICD-10-CM | POA: Diagnosis not present

## 2019-08-26 DIAGNOSIS — R112 Nausea with vomiting, unspecified: Secondary | ICD-10-CM | POA: Diagnosis not present

## 2019-08-26 DIAGNOSIS — G309 Alzheimer's disease, unspecified: Secondary | ICD-10-CM | POA: Diagnosis not present

## 2019-08-26 DIAGNOSIS — N1832 Chronic kidney disease, stage 3b: Secondary | ICD-10-CM | POA: Diagnosis not present

## 2019-08-27 DIAGNOSIS — R112 Nausea with vomiting, unspecified: Secondary | ICD-10-CM | POA: Diagnosis not present

## 2019-08-27 DIAGNOSIS — N1832 Chronic kidney disease, stage 3b: Secondary | ICD-10-CM | POA: Diagnosis not present

## 2019-08-27 DIAGNOSIS — K567 Ileus, unspecified: Secondary | ICD-10-CM | POA: Diagnosis not present

## 2019-08-27 DIAGNOSIS — G309 Alzheimer's disease, unspecified: Secondary | ICD-10-CM | POA: Diagnosis not present

## 2019-08-27 DIAGNOSIS — K5900 Constipation, unspecified: Secondary | ICD-10-CM | POA: Diagnosis not present

## 2019-08-28 DIAGNOSIS — N1832 Chronic kidney disease, stage 3b: Secondary | ICD-10-CM | POA: Diagnosis not present

## 2019-08-28 DIAGNOSIS — G309 Alzheimer's disease, unspecified: Secondary | ICD-10-CM | POA: Diagnosis not present

## 2019-08-28 DIAGNOSIS — K5900 Constipation, unspecified: Secondary | ICD-10-CM | POA: Diagnosis not present

## 2019-08-28 DIAGNOSIS — K567 Ileus, unspecified: Secondary | ICD-10-CM | POA: Diagnosis not present

## 2019-08-28 DIAGNOSIS — R112 Nausea with vomiting, unspecified: Secondary | ICD-10-CM | POA: Diagnosis not present

## 2019-08-29 DIAGNOSIS — N1832 Chronic kidney disease, stage 3b: Secondary | ICD-10-CM | POA: Diagnosis not present

## 2019-08-29 DIAGNOSIS — R109 Unspecified abdominal pain: Secondary | ICD-10-CM | POA: Diagnosis not present

## 2019-08-29 DIAGNOSIS — D649 Anemia, unspecified: Secondary | ICD-10-CM | POA: Diagnosis not present

## 2019-08-29 DIAGNOSIS — K567 Ileus, unspecified: Secondary | ICD-10-CM | POA: Diagnosis not present

## 2019-08-29 DIAGNOSIS — G309 Alzheimer's disease, unspecified: Secondary | ICD-10-CM | POA: Diagnosis not present

## 2019-08-30 DIAGNOSIS — N1832 Chronic kidney disease, stage 3b: Secondary | ICD-10-CM | POA: Diagnosis not present

## 2019-08-30 DIAGNOSIS — K567 Ileus, unspecified: Secondary | ICD-10-CM | POA: Diagnosis not present

## 2019-08-30 DIAGNOSIS — R109 Unspecified abdominal pain: Secondary | ICD-10-CM | POA: Diagnosis not present

## 2019-08-30 DIAGNOSIS — G309 Alzheimer's disease, unspecified: Secondary | ICD-10-CM | POA: Diagnosis not present

## 2019-08-30 DIAGNOSIS — D649 Anemia, unspecified: Secondary | ICD-10-CM | POA: Diagnosis not present

## 2019-11-06 DEATH — deceased
